# Patient Record
Sex: Female | Born: 1995 | Race: White | Hispanic: No | Marital: Single | Smoking: Former smoker
Health system: Southern US, Community
[De-identification: ages and names within clinical notes are randomized; demographics above are authoritative.]

## PROBLEM LIST (undated history)

## (undated) ENCOUNTER — Inpatient Hospital Stay (HOSPITAL_COMMUNITY): Payer: Self-pay

## (undated) ENCOUNTER — Emergency Department (HOSPITAL_COMMUNITY): Discharge status: Absent without leave | Payer: Self-pay

## (undated) ENCOUNTER — Inpatient Hospital Stay: Payer: Self-pay

## (undated) DIAGNOSIS — N390 Urinary tract infection, site not specified: Secondary | ICD-10-CM

## (undated) DIAGNOSIS — A749 Chlamydial infection, unspecified: Secondary | ICD-10-CM

## (undated) DIAGNOSIS — R51 Headache: Secondary | ICD-10-CM

## (undated) DIAGNOSIS — O1490 Unspecified pre-eclampsia, unspecified trimester: Secondary | ICD-10-CM

## (undated) HISTORY — DX: Unspecified pre-eclampsia, unspecified trimester: O14.90

## (undated) HISTORY — DX: Headache: R51

---
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
## 2015-02-03 ENCOUNTER — Ambulatory Visit: Payer: Medicaid Other | Admitting: Family Medicine

## 2015-03-17 ENCOUNTER — Telehealth: Payer: Self-pay | Admitting: *Deleted

## 2015-03-17 MED ORDER — CIPROFLOXACIN HCL 500 MG PO TABS: 500.0000 mg | ORAL_TABLET | Freq: Two times a day (BID) | ORAL | Status: DC

## 2015-03-17 MED ORDER — FLUCONAZOLE 150 MG PO TABS: 150.0000 mg | ORAL_TABLET | Freq: Once | ORAL | Status: DC

## 2015-03-17 NOTE — Telephone Encounter (Signed)
Patient is calling because she has a urinary tract infection and a yeast infection.  She would like something called in for her as she is unable to take time off from work to be seen for an appointment.

## 2015-03-30 ENCOUNTER — Inpatient Hospital Stay (HOSPITAL_COMMUNITY): Payer: Medicaid Other

## 2015-03-30 ENCOUNTER — Encounter (HOSPITAL_COMMUNITY): Payer: Self-pay | Admitting: *Deleted

## 2015-03-30 ENCOUNTER — Inpatient Hospital Stay (HOSPITAL_COMMUNITY)
Admission: AD | Admit: 2015-03-30 | Discharge: 2015-03-30 | Disposition: A | Discharge status: Home / Self Care | Payer: Medicaid Other | Source: Ambulatory Visit | Attending: Obstetrics & Gynecology | Admitting: Obstetrics & Gynecology

## 2015-03-30 DIAGNOSIS — O209 Hemorrhage in early pregnancy, unspecified: Secondary | ICD-10-CM | POA: Insufficient documentation

## 2015-03-30 DIAGNOSIS — R109 Unspecified abdominal pain: Secondary | ICD-10-CM | POA: Diagnosis not present

## 2015-03-30 DIAGNOSIS — Z3A01 Less than 8 weeks gestation of pregnancy: Secondary | ICD-10-CM | POA: Diagnosis not present

## 2015-03-30 DIAGNOSIS — B373 Candidiasis of vulva and vagina: Secondary | ICD-10-CM | POA: Insufficient documentation

## 2015-03-30 DIAGNOSIS — O9989 Other specified diseases and conditions complicating pregnancy, childbirth and the puerperium: Secondary | ICD-10-CM

## 2015-03-30 DIAGNOSIS — O98811 Other maternal infectious and parasitic diseases complicating pregnancy, first trimester: Secondary | ICD-10-CM | POA: Insufficient documentation

## 2015-03-30 DIAGNOSIS — O26899 Other specified pregnancy related conditions, unspecified trimester: Secondary | ICD-10-CM

## 2015-03-30 DIAGNOSIS — Z87891 Personal history of nicotine dependence: Secondary | ICD-10-CM | POA: Insufficient documentation

## 2015-03-30 DIAGNOSIS — B3731 Acute candidiasis of vulva and vagina: Secondary | ICD-10-CM

## 2015-03-30 HISTORY — DX: Urinary tract infection, site not specified: N39.0

## 2015-03-30 HISTORY — DX: Chlamydial infection, unspecified: A74.9

## 2015-03-30 LAB — CBC WITH DIFFERENTIAL/PLATELET
BASOS ABS: 0 10*3/uL (ref 0.0–0.1)
BASOS PCT: 0 % (ref 0–1)
EOS ABS: 0 10*3/uL (ref 0.0–0.7)
Eosinophils Relative: 0 % (ref 0–5)
HCT: 32.3 % — ABNORMAL LOW (ref 36.0–46.0)
Hemoglobin: 11 g/dL — ABNORMAL LOW (ref 12.0–15.0)
LYMPHS ABS: 1.2 10*3/uL (ref 0.7–4.0)
Lymphocytes Relative: 16 % (ref 12–46)
MCH: 28.4 pg (ref 26.0–34.0)
MCHC: 34.1 g/dL (ref 30.0–36.0)
MCV: 83.2 fL (ref 78.0–100.0)
Monocytes Absolute: 0.8 10*3/uL (ref 0.1–1.0)
Monocytes Relative: 11 % (ref 3–12)
Neutro Abs: 5.1 10*3/uL (ref 1.7–7.7)
Neutrophils Relative %: 73 % (ref 43–77)
PLATELETS: 227 10*3/uL (ref 150–400)
RBC: 3.88 MIL/uL (ref 3.87–5.11)
RDW: 14.1 % (ref 11.5–15.5)
WBC: 7 10*3/uL (ref 4.0–10.5)

## 2015-03-30 LAB — URINALYSIS, DIPSTICK ONLY
Bilirubin Urine: NEGATIVE
Glucose, UA: NEGATIVE mg/dL
Hgb urine dipstick: NEGATIVE
Ketones, ur: NEGATIVE mg/dL
Nitrite: NEGATIVE
Protein, ur: NEGATIVE mg/dL
Specific Gravity, Urine: <1.005 — ABNORMAL LOW (ref 1.005–1.030)
Urobilinogen, UA: 1 mg/dL (ref 0.0–1.0)
pH: 6 (ref 5.0–8.0)

## 2015-03-30 LAB — WET PREP, GENITAL: Trich, Wet Prep: NONE SEEN

## 2015-03-30 LAB — HCG, QUANTITATIVE, PREGNANCY: hCG, Beta Chain, Quant, S: 75469 m[IU]/mL — ABNORMAL HIGH (ref <5)

## 2015-03-30 LAB — POCT PREGNANCY, URINE: Preg Test, Ur: POSITIVE — AB

## 2015-03-30 MED ORDER — TERCONAZOLE 0.4 % VA CREA: 1.0000 | TOPICAL_CREAM | Freq: Every day | VAGINAL | Status: DC

## 2015-03-30 NOTE — MAU Note (Signed)
Pt had a child 8 months ago and havnt had a cycle since.  Had a +HPT two days ago along with low abd cramping. Denies any vag bleeding, or discharge that is different for her nor difficulties while voiding.

## 2015-03-30 NOTE — Discharge Instructions (Signed)

## 2015-03-30 NOTE — MAU Provider Note (Signed)
History     CSN: [id number]  Arrival date and time: 03/30/15 2028   First Provider Initiated Contact with Patient 03/30/15 2106      No chief complaint on file. CC: Abd pain with new pregnancy HPI Meagan Carpenter 19 y.o. [id number] @Unknown  presents to MAU with abdominal cramping in setting of positive pregnancy test.  She reports no period since last child born 08/03/14.  She used Depo one time and then switched to pills but could not remember to take every day. She has not used pills in 2 months.  She has been having abdominal cramping x 2 days.  It comes and goes, up to 7/10.  It is better with rest.  She has not used any medication to treat.  She denies nausea, vomiting, vaginal bleeding, fever, weakness.  She does have dysuria - pain in belly when urinating.  NO vaginal discharge. OB History    Gravida Para Term Preterm AB TAB SAB Ectopic Multiple Living   3 1 1  0 1 0 1 0 0 1      Past Medical History  Diagnosis Date  . Headache(784.0)   . UTI (lower urinary tract infection)   . Chlamydia     Past Surgical History  Procedure Laterality Date  . Elbow fracture surgery  2012    Family History  Problem Relation Age of Onset  . Heart failure Paternal Grandmother     pacemaker  . Hyperlipidemia Paternal Grandmother   . Hypertension Paternal Grandmother   . Cancer Paternal Grandmother 86    great grandmother  . Migraines Paternal Grandmother   . Stroke Paternal Grandfather   . ADD / ADHD Father   . ADD / ADHD Brother   . Autism Other     paternal [place] Grandmother & Paternal Randie Heinz Aunt had Autism    History  Substance Use Topics  . Smoking status: Former Smoker    Types: Cigarettes    Quit date: 01/01/2010  . Smokeless tobacco: Never Used  . Alcohol Use: No    Allergies: No Known Allergies  Prescriptions prior to admission  Medication Sig Dispense Refill Last Dose  . ciprofloxacin (CIPRO) 500 MG tablet Take 1 tablet (500 mg total) by mouth 2 (two) times  daily. (Patient not taking: Reported on 03/30/2015) 6 tablet 0 Completed Course at Unknown time  . fluconazole (DIFLUCAN) 150 MG tablet Take 1 tablet (150 mg total) by mouth once. Repeat if symptoms persist in 2-3 days. (Patient not taking: Reported on 03/30/2015) 2 tablet 0 Completed Course at Unknown time  . norgestimate-ethinyl estradiol (ORTHO-CYCLEN,SPRINTEC,PREVIFEM) 0.25-35 MG-MCG tablet Take 1 tablet by mouth daily. (Patient not taking: Reported on 12/31/2014) 1 Package 11 Not Taking at Unknown time  . sertraline (ZOLOFT) 50 MG tablet Take 1 tablet (50 mg total) by mouth daily. (Patient not taking: Reported on 12/31/2014) 30 tablet 5 Not Taking at Unknown time    ROS Pertinent ROS in HPI.  All other systems are negative.   Physical Exam   Blood pressure 112/71, pulse 79, temperature 97.9 F (36.6 C), temperature source Oral, resp. rate 16, height 5\' 5"  (1.651 m), weight 110 lb (49.896 kg), not currently breastfeeding.  Physical Exam  Constitutional: She is oriented to person, place, and time. She appears well-developed and well-nourished. No distress.  HENT:  Head: Normocephalic and atraumatic.  Eyes: EOM are normal.  Neck: Normal range of motion.  Cardiovascular: Normal rate, regular rhythm and normal heart sounds.   Respiratory: Effort normal and  breath sounds normal. No respiratory distress.  GI: Soft. Bowel sounds are normal. She exhibits no distension. There is no tenderness.  Genitourinary:  Erythematous mucosa Thick, adherent clumps of white discharge in vagina Cervix is closed. No CMT.  No adnexal mass or tenderness  Musculoskeletal: Normal range of motion.  Neurological: She is alert and oriented to person, place, and time.  Skin: Skin is warm and dry.  Psychiatric: She has a normal mood and affect.   Results for orders placed or performed during the hospital encounter of 03/30/15 (from the past 24 hour(s))  Urinalysis, dipstick only     Status: Abnormal   Collection  Time: 03/30/15  8:30 PM  Result Value Ref Range   Specific Gravity, Urine <1.005 (L) 1.005 - 1.030   pH 6.0 5.0 - 8.0   Glucose, UA NEGATIVE NEGATIVE mg/dL   Hgb urine dipstick NEGATIVE NEGATIVE   Bilirubin Urine NEGATIVE NEGATIVE   Ketones, ur NEGATIVE NEGATIVE mg/dL   Protein, ur NEGATIVE NEGATIVE mg/dL   Urobilinogen, UA 1.0 0.0 - 1.0 mg/dL   Nitrite NEGATIVE NEGATIVE   Leukocytes, UA SMALL (A) NEGATIVE  Pregnancy, urine POC     Status: Abnormal   Collection Time: 03/30/15  8:38 PM  Result Value Ref Range   Preg Test, Ur POSITIVE (A) NEGATIVE  CBC with Differential/Platelet     Status: Abnormal   Collection Time: 03/30/15  9:25 PM  Result Value Ref Range   WBC 7.0 4.0 - 10.5 K/uL   RBC 3.88 3.87 - 5.11 MIL/uL   Hemoglobin 11.0 (L) 12.0 - 15.0 g/dL   HCT 16.1 (L) 09.6 - 04.5 %   MCV 83.2 78.0 - 100.0 fL   MCH 28.4 26.0 - 34.0 pg   MCHC 34.1 30.0 - 36.0 g/dL   RDW 40.9 81.1 - 91.4 %   Platelets 227 150 - 400 K/uL   Neutrophils Relative % 73 43 - 77 %   Neutro Abs 5.1 1.7 - 7.7 K/uL   Lymphocytes Relative 16 12 - 46 %   Lymphs Abs 1.2 0.7 - 4.0 K/uL   Monocytes Relative 11 3 - 12 %   Monocytes Absolute 0.8 0.1 - 1.0 K/uL   Eosinophils Relative 0 0 - 5 %   Eosinophils Absolute 0.0 0.0 - 0.7 K/uL   Basophils Relative 0 0 - 1 %   Basophils Absolute 0.0 0.0 - 0.1 K/uL  Wet prep, genital     Status: Abnormal   Collection Time: 03/30/15  9:30 PM  Result Value Ref Range   Yeast Wet Prep HPF POC FEW (A) NONE SEEN   Trich, Wet Prep NONE SEEN NONE SEEN   Clue Cells Wet Prep HPF POC FEW (A) NONE SEEN   WBC, Wet Prep HPF POC TOO NUMEROUS TO COUNT (A) NONE SEEN   US Ob Comp Less 14 Wks  03/30/2015   CLINICAL DATA:  Pregnant patient with pelvic cramping for 3 days.  EXAM: OBSTETRIC <14 WK [place] AND TRANSVAGINAL OB US  TECHNIQUE: Both transabdominal and transvaginal ultrasound examinations were performed for complete evaluation of the gestation as well as the maternal uterus, adnexal  regions, and pelvic cul-de-sac. Transvaginal technique was performed to assess early pregnancy.  COMPARISON:  None.  FINDINGS: Intrauterine gestational sac: Visualized/normal in shape.  Yolk sac:  Present.  Embryo:  Present.  Cardiac Activity: Present.  Heart Rate: 132  bpm  CRL:  8.5  mm   6 w   6 d  [place] EDC: 11/17/2015  Maternal uterus/adnexae: No subchorionic hemorrhage. Both ovaries appear normal in size. There is no adnexal mass. Trace free fluid in the pelvis.  IMPRESSION: Single live intrauterine pregnancy estimated gestational age [redacted] weeks 6 days for estimated date of delivery 11/17/2015.   Electronically Signed   By: Rubye Oaks M.D.   On: 03/30/2015 22:18   US Ob Transvaginal  03/30/2015   CLINICAL DATA:  Pregnant patient with pelvic cramping for 3 days.  EXAM: OBSTETRIC <14 WK [place] AND TRANSVAGINAL OB US  TECHNIQUE: Both transabdominal and transvaginal ultrasound examinations were performed for complete evaluation of the gestation as well as the maternal uterus, adnexal regions, and pelvic cul-de-sac. Transvaginal technique was performed to assess early pregnancy.  COMPARISON:  None.  FINDINGS: Intrauterine gestational sac: Visualized/normal in shape.  Yolk sac:  Present.  Embryo:  Present.  Cardiac Activity: Present.  Heart Rate: 132  bpm  CRL:  8.5  mm   6 w   6 d                  [place] EDC: 11/17/2015  Maternal uterus/adnexae: No subchorionic hemorrhage. Both ovaries appear normal in size. There is no adnexal mass. Trace free fluid in the pelvis.  IMPRESSION: Single live intrauterine pregnancy estimated gestational age [redacted] weeks 6 days for estimated date of delivery 11/17/2015.   Electronically Signed   By: Rubye Oaks M.D.   On: 03/30/2015 22:18    MAU Course  Procedures      MDM Ectopic workup ordered to confirm location of pregnancy due to Positive pregnancy test with abdominal pain.  U/S ordered and images reviewed.  IUP per U/S - no concern for ectopic Yeast seen on  wet prep and consistent with exam   Assessment and Plan   1. Abdominal pain in pregnancy   2. Vaginal bleeding in pregnancy, first trimester   3. Vulvovaginal candidiasis    P: Discharge to home Terazol for yeast [facility] daily [facility] asap Patient may return to MAU as needed or if her condition were to change or worsen   Bertram Denver 03/30/2015, 9:08 PM

## 2015-04-08 ENCOUNTER — Encounter: Payer: Self-pay | Admitting: Family Medicine

## 2015-04-08 ENCOUNTER — Ambulatory Visit (INDEPENDENT_AMBULATORY_CARE_PROVIDER_SITE_OTHER): Payer: Medicaid Other | Admitting: Family Medicine

## 2015-04-08 VITALS — BP 107/71 | HR 91 | Wt 114.0 lb

## 2015-04-08 DIAGNOSIS — Z3490 Encounter for supervision of normal pregnancy, unspecified, unspecified trimester: Secondary | ICD-10-CM | POA: Insufficient documentation

## 2015-04-08 DIAGNOSIS — Z3481 Encounter for supervision of other normal pregnancy, first trimester: Secondary | ICD-10-CM

## 2015-04-08 NOTE — Progress Notes (Signed)
   Subjective:    Meagan Carpenter is a [id number] being seen today for her first obstetrical visit.  Her obstetrical history is not significant. Patient does intend to breast feed. Pregnancy history fully reviewed.  Patient reports no complaints.  Filed Vitals:   04/08/15 1519  BP: 107/71  Pulse: 91  Weight: 114 lb (51.71 kg)    HISTORY: OB History  Gravida Para Term Preterm AB SAB TAB Ectopic Multiple Living  3 1 1  0 1 1 0 0 0 1    # Outcome Date GA Lbr Len/2nd Weight Sex Delivery Anes PTL Lv  3 Current           2 Term 08/03/14 [id number] 03:51 / 02:57 7 lb 3 oz (3.26 kg) M Vag-Vacuum EPI  Y  1 SAB 2014             Past Medical History  Diagnosis Date  . Headache(784.0)   . UTI (lower urinary tract infection)   . Chlamydia    Past Surgical History  Procedure Laterality Date  . Elbow fracture surgery  2012   Family History  Problem Relation Age of Onset  . Heart failure Paternal Grandmother     pacemaker  . Hyperlipidemia Paternal Grandmother   . Hypertension Paternal Grandmother   . Cancer Paternal Grandmother [id number]    great grandmother  . Migraines Paternal Grandmother   . Stroke Paternal Grandfather   . ADD / ADHD Father   . ADD / ADHD Brother   . Autism Other     paternal [place]Great Grandmother & Paternal Randie HeinzGreat Aunt had Autism     Exam    System: Breast:  normal appearance, no masses or tenderness   Skin: normal coloration and turgor, no rashes    Neurologic: normal   Extremities: normal strength, tone, and muscle mass   HEENT extra ocular movement intact and sclera clear, anicteric   Mouth/Teeth mucous membranes moist, pharynx normal without lesions   Neck supple   Cardiovascular: regular rate and rhythm, no murmurs or gallops   Respiratory:  appears well, vitals normal, no respiratory distress, acyanotic, normal RR, ear and throat exam is normal, neck free of mass or lymphadenopathy, chest clear, no wheezing, crepitations, rhonchi, normal symmetric air  entry   Abdomen: soft, non-tender; bowel sounds normal; no masses,  no organomegaly   TVUS reveals SIUP + flicker c/w dates   Assessment:    Pregnancy: [id number] Patient Active Problem List   Diagnosis Date Noted  . Supervision of normal pregnancy, antepartum 04/08/2015  . Tension headache 03/23/2013  . Migraine headache without aura 03/23/2013        Plan:     Initial labs drawn. Prenatal vitamins. Problem list reviewed and updated. Genetic Screening discussed First Screen: declined. Problem List Items Addressed This Visit      Medium   Supervision of normal pregnancy, antepartum - Primary   Relevant Orders   Prenatal Profile   Culture, OB Urine   GC/Chlamydia Probe Amp     Return in 4 weeks (on 05/06/2015).   [name] 04/08/2015

## 2015-04-08 NOTE — Patient Instructions (Signed)
First Trimester of Pregnancy The first trimester of pregnancy is from week 1 until the end of week 12 (months 1 through 3). A week after a sperm fertilizes an egg, the egg will implant on the wall of the uterus. This embryo will begin to develop into a baby. Genes from you and your partner are forming the baby. The female genes determine whether the baby is a boy or a girl. At 6-8 weeks, the eyes and face are formed, and the heartbeat can be seen on ultrasound. At the end of 12 weeks, all the baby's organs are formed.  Now that you are pregnant, you will want to do everything you can to have a healthy baby. Two of the most important things are to get good prenatal care and to follow your health care provider's instructions. Prenatal care is all the medical care you receive before the baby's birth. This care will help prevent, find, and treat any problems during the pregnancy and childbirth. BODY CHANGES Your body goes through many changes during pregnancy. The changes vary from woman to woman.   You may gain or lose a couple of pounds at first.  You may feel sick to your stomach (nauseous) and throw up (vomit). If the vomiting is uncontrollable, call your health care provider.  You may tire easily.  You may develop headaches that can be relieved by medicines approved by your health care provider.  You may urinate more often. Painful urination may mean you have a bladder infection.  You may develop heartburn as a result of your pregnancy.  You may develop constipation because certain hormones are causing the muscles that push waste through your intestines to slow down.  You may develop hemorrhoids or swollen, bulging veins (varicose veins).  Your breasts may begin to grow larger and become tender. Your nipples may stick out more, and the tissue that surrounds them (areola) may become darker.  Your gums may bleed and may be sensitive to brushing and flossing.  Dark spots or blotches  (chloasma, mask of pregnancy) may develop on your face. This will likely fade after the baby is born.  Your menstrual periods will stop.  You may have a loss of appetite.  You may develop cravings for certain kinds of food.  You may have changes in your emotions from day to day, such as being excited to be pregnant or being concerned that something may go wrong with the pregnancy and baby.  You may have more vivid and strange dreams.  You may have changes in your hair. These can include thickening of your hair, rapid growth, and changes in texture. Some women also have hair loss during or after pregnancy, or hair that feels dry or thin. Your hair will most likely return to normal after your baby is born. WHAT TO EXPECT AT YOUR PRENATAL VISITS During a routine prenatal visit:  You will be weighed to make sure you and the baby are growing normally.  Your blood pressure will be taken.  Your abdomen will be measured to track your baby's growth.  The fetal heartbeat will be listened to starting around week 10 or 12 of your pregnancy.  Test results from any previous visits will be discussed. Your health care provider may ask you:  How you are feeling.  If you are feeling the baby move.  If you have had any abnormal symptoms, such as leaking fluid, bleeding, severe headaches, or abdominal cramping.  If you have any questions. Other tests   that may be performed during your first trimester include:  Blood tests to find your blood type and to check for the presence of any previous infections. They will also be used to check for low iron levels (anemia) and Rh antibodies. Later in the pregnancy, blood tests for diabetes will be done along with other tests if problems develop.  Urine tests to check for infections, diabetes, or protein in the urine.  An ultrasound to confirm the proper growth and development of the baby.  An amniocentesis to check for possible genetic problems.  Fetal  screens for spina bifida and Down syndrome.  You may need other tests to make sure you and the baby are doing well. HOME CARE INSTRUCTIONS  Medicines  Follow your health care provider's instructions regarding medicine use. Specific medicines may be either safe or unsafe to take during pregnancy.  Take your prenatal vitamins as directed.  If you develop constipation, try taking a stool softener if your health care provider approves. Diet  Eat regular, well-balanced meals. Choose a variety of foods, such as meat or vegetable-based protein, fish, milk and low-fat dairy products, vegetables, fruits, and whole grain breads and cereals. Your health care provider will help you determine the amount of weight gain that is right for you.  Avoid raw meat and uncooked cheese. These carry germs that can cause birth defects in the baby.  Eating four or five small meals rather than three large meals a day may help relieve nausea and vomiting. If you start to feel nauseous, eating a few soda crackers can be helpful. Drinking liquids between meals instead of during meals also seems to help nausea and vomiting.  If you develop constipation, eat more high-fiber foods, such as fresh vegetables or fruit and whole grains. Drink enough fluids to keep your urine clear or pale yellow. Activity and Exercise  Exercise only as directed by your health care provider. Exercising will help you:  Control your weight.  Stay in shape.  Be prepared for labor and delivery.  Experiencing pain or cramping in the lower abdomen or low back is a good sign that you should stop exercising. Check with your health care provider before continuing normal exercises.  Try to avoid standing for long periods of time. Move your legs often if you must stand in one place for a long time.  Avoid heavy lifting.  Wear low-heeled shoes, and practice good posture.  You may continue to have sex unless your health care provider directs you  otherwise. Relief of Pain or Discomfort  Wear a good support bra for breast tenderness.   Take warm sitz baths to soothe any pain or discomfort caused by hemorrhoids. Use hemorrhoid cream if your health care provider approves.   Rest with your legs elevated if you have leg cramps or low back pain.  If you develop varicose veins in your legs, wear support hose. Elevate your feet for 15 minutes, 3-4 times a day. Limit salt in your diet. Prenatal Care  Schedule your prenatal visits by the twelfth week of pregnancy. They are usually scheduled monthly at first, then more often in the last 2 months before delivery.  Write down your questions. Take them to your prenatal visits.  Keep all your prenatal visits as directed by your health care provider. Safety  Wear your seat belt at all times when driving.  Make a list of emergency phone numbers, including numbers for family, friends, the hospital, and police and fire departments. General Tips    Ask your health care provider for a referral to a local prenatal education class. Begin classes no later than at the beginning of month 6 of your pregnancy.  Ask for help if you have counseling or nutritional needs during pregnancy. Your health care provider can offer advice or refer you to specialists for help with various needs.  Do not use hot tubs, steam rooms, or saunas.  Do not douche or use tampons or scented sanitary pads.  Do not cross your legs for long periods of time.  Avoid cat litter boxes and soil used by cats. These carry germs that can cause birth defects in the baby and possibly loss of the fetus by miscarriage or stillbirth.  Avoid all smoking, herbs, alcohol, and medicines not prescribed by your health care provider. Chemicals in these affect the formation and growth of the baby.  Schedule a dentist appointment. At home, brush your teeth with a soft toothbrush and be gentle when you floss. SEEK MEDICAL CARE IF:   You have  dizziness.  You have mild pelvic cramps, pelvic pressure, or nagging pain in the abdominal area.  You have persistent nausea, vomiting, or diarrhea.  You have a bad smelling vaginal discharge.  You have pain with urination.  You notice increased swelling in your face, hands, legs, or ankles. SEEK IMMEDIATE MEDICAL CARE IF:   You have a fever.  You are leaking fluid from your vagina.  You have spotting or bleeding from your vagina.  You have severe abdominal cramping or pain.  You have rapid weight gain or loss.  You vomit blood or material that looks like coffee grounds.  You are exposed to German measles and have never had them.  You are exposed to fifth disease or chickenpox.  You develop a severe headache.  You have shortness of breath.  You have any kind of trauma, such as from a fall or a car accident. Document Released: 09/14/2001 Document Revised: 02/04/2014 Document Reviewed: 07/31/2013 [employer] Patient Information 2015 [employer]. This information is not intended to replace advice given to you by your health care provider. Make sure you discuss any questions you have with your health care provider.  Breastfeeding Deciding to breastfeed is one of the best choices you can make for you and your baby. A change in hormones during pregnancy causes your breast tissue to grow and increases the number and size of your milk ducts. These hormones also allow proteins, sugars, and fats from your blood supply to make breast milk in your milk-producing glands. Hormones prevent breast milk from being released before your baby is born as well as prompt milk flow after birth. Once breastfeeding has begun, thoughts of your baby, as well as his or her sucking or crying, can stimulate the release of milk from your milk-producing glands.  BENEFITS OF BREASTFEEDING For Your Baby  Your first milk (colostrum) helps your baby's digestive system function better.   There are antibodies  in your milk that help your baby fight off infections.   Your baby has a lower incidence of asthma, allergies, and sudden infant death syndrome.   The nutrients in breast milk are better for your baby than infant formulas and are designed uniquely for your baby's needs.   Breast milk improves your baby's brain development.   Your baby is less likely to develop other conditions, such as childhood obesity, asthma, or type 2 diabetes mellitus.  For You   Breastfeeding helps to create a very special bond between   you and your baby.   Breastfeeding is convenient. Breast milk is always available at the correct temperature and costs nothing.   Breastfeeding helps to burn calories and helps you lose the weight gained during pregnancy.   Breastfeeding makes your uterus contract to its prepregnancy size faster and slows bleeding (lochia) after you give birth.   Breastfeeding helps to lower your risk of developing type 2 diabetes mellitus, osteoporosis, and breast or ovarian cancer later in life. SIGNS THAT YOUR BABY IS HUNGRY Early Signs of Hunger  Increased alertness or activity.  Stretching.  Movement of the head from side to side.  Movement of the head and opening of the mouth when the corner of the mouth or cheek is stroked (rooting).  Increased sucking sounds, smacking lips, cooing, sighing, or squeaking.  Hand-to-mouth movements.  Increased sucking of fingers or hands. Late Signs of Hunger  Fussing.  Intermittent crying. Extreme Signs of Hunger Signs of extreme hunger will require calming and consoling before your baby will be able to breastfeed successfully. Do not wait for the following signs of extreme hunger to occur before you initiate breastfeeding:   Restlessness.  A loud, strong cry.   Screaming. BREASTFEEDING BASICS Breastfeeding Initiation  Find a comfortable place to sit or lie down, with your neck and back well supported.  Place a pillow or  rolled up blanket under your baby to bring him or her to the level of your breast (if you are seated). Nursing pillows are specially designed to help support your arms and your baby while you breastfeed.  Make sure that your baby's abdomen is facing your abdomen.   Gently massage your breast. With your fingertips, massage from your chest wall toward your nipple in a circular motion. This encourages milk flow. You may need to continue this action during the feeding if your milk flows slowly.  Support your breast with 4 fingers underneath and your thumb above your nipple. Make sure your fingers are well away from your nipple and your baby's mouth.   Stroke your baby's lips gently with your finger or nipple.   When your baby's mouth is open wide enough, quickly bring your baby to your breast, placing your entire nipple and as much of the colored area around your nipple (areola) as possible into your baby's mouth.   More areola should be visible above your baby's upper lip than below the lower lip.   Your baby's tongue should be between his or her lower gum and your breast.   Ensure that your baby's mouth is correctly positioned around your nipple (latched). Your baby's lips should create a seal on your breast and be turned out (everted).  It is common for your baby to suck about 2-3 minutes in order to start the flow of breast milk. Latching Teaching your baby how to latch on to your breast properly is very important. An improper latch can cause nipple pain and decreased milk supply for you and poor weight gain in your baby. Also, if your baby is not latched onto your nipple properly, he or she may swallow some air during feeding. This can make your baby fussy. Burping your baby when you switch breasts during the feeding can help to get rid of the air. However, teaching your baby to latch on properly is still the best way to prevent fussiness from swallowing air while breastfeeding. Signs  that your baby has successfully latched on to your nipple:    Silent tugging or silent   sucking, without causing you pain.   Swallowing heard between every 3-4 sucks.    Muscle movement above and in front of his or her ears while sucking.  Signs that your baby has not successfully latched on to nipple:   Sucking sounds or smacking sounds from your baby while breastfeeding.  Nipple pain. If you think your baby has not latched on correctly, slip your finger into the corner of your baby's mouth to break the suction and place it between your baby's gums. Attempt breastfeeding initiation again. Signs of Successful Breastfeeding Signs from your baby:   A gradual decrease in the number of sucks or complete cessation of sucking.   Falling asleep.   Relaxation of his or her body.   Retention of a small amount of milk in his or her mouth.   Letting go of your breast by himself or herself. Signs from you:  Breasts that have increased in firmness, weight, and size 1-3 hours after feeding.   Breasts that are softer immediately after breastfeeding.  Increased milk volume, as well as a change in milk consistency and color by the fifth day of breastfeeding.   Nipples that are not sore, cracked, or bleeding. Signs That Your Baby is Getting Enough Milk  Wetting at least 3 diapers in a 24-hour period. The urine should be clear and pale yellow by age 5 days.  At least 3 stools in a 24-hour period by age 5 days. The stool should be soft and yellow.  At least 3 stools in a 24-hour period by age 7 days. The stool should be seedy and yellow.  No loss of weight greater than 10% of birth weight during the first 3 days of age.  Average weight gain of 4-7 ounces (113-198 g) per week after age 4 days.  Consistent daily weight gain by age 5 days, without weight loss after the age of 2 weeks. After a feeding, your baby may spit up a small amount. This is common. BREASTFEEDING FREQUENCY AND  DURATION Frequent feeding will help you make more milk and can prevent sore nipples and breast engorgement. Breastfeed when you feel the need to reduce the fullness of your breasts or when your baby shows signs of hunger. This is called "breastfeeding on demand." Avoid introducing a pacifier to your baby while you are working to establish breastfeeding (the first 4-6 weeks after your baby is born). After this time you may choose to use a pacifier. Research has shown that pacifier use during the first year of a baby's life decreases the risk of sudden infant death syndrome (SIDS). Allow your baby to feed on each breast as long as he or she wants. Breastfeed until your baby is finished feeding. When your baby unlatches or falls asleep while feeding from the first breast, offer the second breast. Because newborns are often sleepy in the first few weeks of life, you may need to awaken your baby to get him or her to feed. Breastfeeding times will vary from baby to baby. However, the following rules can serve as a guide to help you ensure that your baby is properly fed:  Newborns (babies 4 weeks of age or younger) may breastfeed every 1-3 hours.  Newborns should not go longer than 3 hours during the day or 5 hours during the night without breastfeeding.  You should breastfeed your baby a minimum of 8 times in a 24-hour period until you begin to introduce solid foods to your baby at around 6   months of age. BREAST MILK PUMPING Pumping and storing breast milk allows you to ensure that your baby is exclusively fed your breast milk, even at times when you are unable to breastfeed. This is especially important if you are going back to work while you are still breastfeeding or when you are not able to be present during feedings. Your lactation consultant can give you guidelines on how long it is safe to store breast milk.  A breast pump is a machine that allows you to pump milk from your breast into a sterile bottle.  The pumped breast milk can then be stored in a refrigerator or freezer. Some breast pumps are operated by hand, while others use electricity. Ask your lactation consultant which type will work best for you. Breast pumps can be purchased, but some hospitals and breastfeeding support groups lease breast pumps on a monthly basis. A lactation consultant can teach you how to hand express breast milk, if you prefer not to use a pump.  CARING FOR YOUR BREASTS WHILE YOU BREASTFEED Nipples can become dry, cracked, and sore while breastfeeding. The following recommendations can help keep your breasts moisturized and healthy:  Avoid using soap on your nipples.   Wear a supportive bra. Although not required, special nursing bras and tank tops are designed to allow access to your breasts for breastfeeding without taking off your entire bra or top. Avoid wearing underwire-style bras or extremely tight bras.  Air dry your nipples for 3-4minutes after each feeding.   Use only cotton bra pads to absorb leaked breast milk. Leaking of breast milk between feedings is normal.   Use lanolin on your nipples after breastfeeding. Lanolin helps to maintain your skin's normal moisture barrier. If you use pure lanolin, you do not need to wash it off before feeding your baby again. Pure lanolin is not toxic to your baby. You may also hand express a few drops of breast milk and gently massage that milk into your nipples and allow the milk to air dry. In the first few weeks after giving birth, some women experience extremely full breasts (engorgement). Engorgement can make your breasts feel heavy, warm, and tender to the touch. Engorgement peaks within 3-5 days after you give birth. The following recommendations can help ease engorgement:  Completely empty your breasts while breastfeeding or pumping. You may want to start by applying warm, moist heat (in the shower or with warm water-soaked hand towels) just before feeding or  pumping. This increases circulation and helps the milk flow. If your baby does not completely empty your breasts while breastfeeding, pump any extra milk after he or she is finished.  Wear a snug bra (nursing or regular) or tank top for 1-2 days to signal your body to slightly decrease milk production.  Apply ice packs to your breasts, unless this is too uncomfortable for you.  Make sure that your baby is latched on and positioned properly while breastfeeding. If engorgement persists after 48 hours of following these recommendations, contact your health care provider or a lactation consultant. OVERALL HEALTH CARE RECOMMENDATIONS WHILE BREASTFEEDING  Eat healthy foods. Alternate between meals and snacks, eating 3 of each per day. Because what you eat affects your breast milk, some of the foods may make your baby more irritable than usual. Avoid eating these foods if you are sure that they are negatively affecting your baby.  Drink milk, fruit juice, and water to satisfy your thirst (about 10 glasses a day).   Rest   often, relax, and continue to take your prenatal vitamins to prevent fatigue, stress, and anemia.  Continue breast self-awareness checks.  Avoid chewing and smoking tobacco.  Avoid alcohol and drug use. Some medicines that may be harmful to your baby can pass through breast milk. It is important to ask your health care provider before taking any medicine, including all over-the-counter and prescription medicine as well as vitamin and herbal supplements. It is possible to become pregnant while breastfeeding. If birth control is desired, ask your health care provider about options that will be safe for your baby. SEEK MEDICAL CARE IF:   You feel like you want to stop breastfeeding or have become frustrated with breastfeeding.  You have painful breasts or nipples.  Your nipples are cracked or bleeding.  Your breasts are red, tender, or warm.  You have a swollen area on either  breast.  You have a fever or chills.  You have nausea or vomiting.  You have drainage other than breast milk from your nipples.  Your breasts do not become full before feedings by the fifth day after you give birth.  You feel sad and depressed.  Your baby is too sleepy to eat well.  Your baby is having trouble sleeping.   Your baby is wetting less than 3 diapers in a 24-hour period.  Your baby has less than 3 stools in a 24-hour period.  Your baby's skin or the white part of his or her eyes becomes yellow.   Your baby is not gaining weight by 5 days of age. SEEK IMMEDIATE MEDICAL CARE IF:   Your baby is overly tired (lethargic) and does not want to wake up and feed.  Your baby develops an unexplained fever. Document Released: 09/20/2005 Document Revised: 09/25/2013 Document Reviewed: 03/14/2013 [employer] Patient Information 2015 [employer]. This information is not intended to replace advice given to you by your health care provider. Make sure you discuss any questions you have with your health care provider.  

## 2015-04-08 NOTE — Progress Notes (Signed)
Bedside U/S showed IUP with CRL 17.324mm and GA 8 w 2 day and FHT 181 BPM

## 2015-04-09 LAB — PRENATAL PROFILE (SOLSTAS)
Antibody Screen: NEGATIVE
BASOS ABS: 0 10*3/uL (ref 0.0–0.1)
Basophils Relative: 0 % (ref 0–1)
Eosinophils Absolute: 0 10*3/uL (ref 0.0–0.7)
Eosinophils Relative: 0 % (ref 0–5)
HCT: 34.6 % — ABNORMAL LOW (ref 36.0–46.0)
HIV 1&2 Ab, 4th Generation: NONREACTIVE
Hemoglobin: 11.5 g/dL — ABNORMAL LOW (ref 12.0–15.0)
Hepatitis B Surface Ag: NEGATIVE
LYMPHS ABS: 1.6 10*3/uL (ref 0.7–4.0)
LYMPHS PCT: 18 % (ref 12–46)
MCH: 28 pg (ref 26.0–34.0)
MCHC: 33.2 g/dL (ref 30.0–36.0)
MCV: 84.2 fL (ref 78.0–100.0)
MPV: 10.8 fL (ref 8.6–12.4)
Monocytes Absolute: 0.4 10*3/uL (ref 0.1–1.0)
Monocytes Relative: 5 % (ref 3–12)
Neutro Abs: 6.9 10*3/uL (ref 1.7–7.7)
Neutrophils Relative %: 77 % (ref 43–77)
PLATELETS: 359 10*3/uL (ref 150–400)
RBC: 4.11 MIL/uL (ref 3.87–5.11)
RDW: 14.9 % (ref 11.5–15.5)
Rh Type: POSITIVE
Rubella: 1.85 Index — ABNORMAL HIGH (ref <0.90)
WBC: 8.9 10*3/uL (ref 4.0–10.5)

## 2015-04-10 LAB — CULTURE, OB URINE

## 2015-04-11 LAB — GC/CHLAMYDIA PROBE AMP
CT PROBE, AMP APTIMA: NEGATIVE
GC Probe RNA: NEGATIVE

## 2015-04-16 ENCOUNTER — Telehealth: Payer: Self-pay | Admitting: *Deleted

## 2015-04-16 DIAGNOSIS — Z3491 Encounter for supervision of normal pregnancy, unspecified, first trimester: Secondary | ICD-10-CM

## 2015-04-16 MED ORDER — CONCEPT DHA 53.5-38-1 MG PO CAPS: ORAL_CAPSULE | ORAL | Status: DC

## 2015-04-16 NOTE — Telephone Encounter (Signed)
Patient called and needed refills on prenatal vitamins.  I have sent in refills.

## 2015-05-06 ENCOUNTER — Encounter: Payer: Self-pay | Admitting: Obstetrics & Gynecology

## 2015-05-21 ENCOUNTER — Ambulatory Visit (INDEPENDENT_AMBULATORY_CARE_PROVIDER_SITE_OTHER): Payer: Medicaid Other | Admitting: Certified Nurse Midwife

## 2015-05-21 VITALS — BP 111/75 | HR 77 | Wt 117.0 lb

## 2015-05-21 DIAGNOSIS — Z3482 Encounter for supervision of other normal pregnancy, second trimester: Secondary | ICD-10-CM

## 2015-05-21 NOTE — Progress Notes (Signed)
Subjective:  Meagan Carpenter is a 19 y.o. G3P1011 at [redacted]w[redacted]d being seen today for ongoing prenatal care.  Patient reports no complaints.  Contractions: Not present.  Vag. Bleeding: None. Movement: Absent. Denies leaking of fluid.   The following portions of the patient's history were reviewed and updated as appropriate: allergies, current medications, past family history, past medical history, past social history, past surgical history and problem list.   Objective:   Filed Vitals:   05/21/15 1448  BP: 111/75  Pulse: 77  Weight: 117 lb (53.071 kg)    Fetal Status: Fetal Heart Rate (bpm): 147   Movement: Absent     General:  Alert, oriented and cooperative. Patient is in no acute distress.  Skin: Skin is warm and dry. No rash noted.   Cardiovascular: Normal heart rate noted  Respiratory: Normal respiratory effort, no problems with respiration noted  Abdomen: Soft, gravid, appropriate for gestational age. Pain/Pressure: Present     Pelvic: Vag. Bleeding: None Vag D/C Character: Thin   Cervical exam deferred        Extremities: Normal range of motion.  Edema: None  Mental Status: Normal mood and affect. Normal behavior. Normal judgment and thought content.   Urinalysis: Urine Protein: Trace Urine Glucose: Negative  Assessment and Plan:  Pregnancy: G3P1011 at [redacted]w[redacted]d  There are no diagnoses linked to this encounter. Preterm labor symptoms and general obstetric precautions including but not limited to vaginal bleeding, contractions, leaking of fluid and fetal movement were reviewed in detail with the patient. Please refer to After Visit Summary for other counseling recommendations.  No Follow-up on file.   Rhea Pink, CNM

## 2015-05-21 NOTE — Patient Instructions (Signed)
Second Trimester of Pregnancy The second trimester is from week 13 through week 28, months 4 through 6. The second trimester is often a time when you feel your best. Your body has also adjusted to being pregnant, and you begin to feel better physically. Usually, morning sickness has lessened or quit completely, you may have more energy, and you may have an increase in appetite. The second trimester is also a time when the fetus is growing rapidly. At the end of the sixth month, the fetus is about 9 inches long and weighs about 1 pounds. You will likely begin to feel the baby move (quickening) between 18 and 20 weeks of the pregnancy. BODY CHANGES Your body goes through many changes during pregnancy. The changes vary from woman to woman.   Your weight will continue to increase. You will notice your lower abdomen bulging out.  You may begin to get stretch marks on your hips, abdomen, and breasts.  You may develop headaches that can be relieved by medicines approved by your health care provider.  You may urinate more often because the fetus is pressing on your bladder.  You may develop or continue to have heartburn as a result of your pregnancy.  You may develop constipation because certain hormones are causing the muscles that push waste through your intestines to slow down.  You may develop hemorrhoids or swollen, bulging veins (varicose veins).  You may have back pain because of the weight gain and pregnancy hormones relaxing your joints between the bones in your pelvis and as a result of a shift in weight and the muscles that support your balance.  Your breasts will continue to grow and be tender.  Your gums may bleed and may be sensitive to brushing and flossing.  Dark spots or blotches (chloasma, mask of pregnancy) may develop on your face. This will likely fade after the baby is born.  A dark line from your belly button to the pubic area (linea nigra) may appear. This will likely fade  after the baby is born.  You may have changes in your hair. These can include thickening of your hair, rapid growth, and changes in texture. Some women also have hair loss during or after pregnancy, or hair that feels dry or thin. Your hair will most likely return to normal after your baby is born. WHAT TO EXPECT AT YOUR PRENATAL VISITS During a routine prenatal visit:  You will be weighed to make sure you and the fetus are growing normally.  Your blood pressure will be taken.  Your abdomen will be measured to track your baby's growth.  The fetal heartbeat will be listened to.  Any test results from the previous visit will be discussed. Your health care provider may ask you:  How you are feeling.  If you are feeling the baby move.  If you have had any abnormal symptoms, such as leaking fluid, bleeding, severe headaches, or abdominal cramping.  If you have any questions. Other tests that may be performed during your second trimester include:  Blood tests that check for:  Low iron levels (anemia).  Gestational diabetes (between 24 and 28 weeks).  Rh antibodies.  Urine tests to check for infections, diabetes, or protein in the urine.  An ultrasound to confirm the proper growth and development of the baby.  An amniocentesis to check for possible genetic problems.  Fetal screens for spina bifida and Down syndrome. HOME CARE INSTRUCTIONS   Avoid all smoking, herbs, alcohol, and unprescribed   drugs. These chemicals affect the formation and growth of the baby.  Follow your health care provider's instructions regarding medicine use. There are medicines that are either safe or unsafe to take during pregnancy.  Exercise only as directed by your health care provider. Experiencing uterine cramps is a good sign to stop exercising.  Continue to eat regular, healthy meals.  Wear a good support bra for breast tenderness.  Do not use hot tubs, steam rooms, or saunas.  Wear your  seat belt at all times when driving.  Avoid raw meat, uncooked cheese, cat litter boxes, and soil used by cats. These carry germs that can cause birth defects in the baby.  Take your prenatal vitamins.  Try taking a stool softener (if your health care provider approves) if you develop constipation. Eat more high-fiber foods, such as fresh vegetables or fruit and whole grains. Drink plenty of fluids to keep your urine clear or pale yellow.  Take warm sitz baths to soothe any pain or discomfort caused by hemorrhoids. Use hemorrhoid cream if your health care provider approves.  If you develop varicose veins, wear support hose. Elevate your feet for 15 minutes, 3-4 times a day. Limit salt in your diet.  Avoid heavy lifting, wear low heel shoes, and practice good posture.  Rest with your legs elevated if you have leg cramps or low back pain.  Visit your dentist if you have not gone yet during your pregnancy. Use a soft toothbrush to brush your teeth and be gentle when you floss.  A sexual relationship may be continued unless your health care provider directs you otherwise.  Continue to go to all your prenatal visits as directed by your health care provider. SEEK MEDICAL CARE IF:   You have dizziness.  You have mild pelvic cramps, pelvic pressure, or nagging pain in the abdominal area.  You have persistent nausea, vomiting, or diarrhea.  You have a bad smelling vaginal discharge.  You have pain with urination. SEEK IMMEDIATE MEDICAL CARE IF:   You have a fever.  You are leaking fluid from your vagina.  You have spotting or bleeding from your vagina.  You have severe abdominal cramping or pain.  You have rapid weight gain or loss.  You have shortness of breath with chest pain.  You notice sudden or extreme swelling of your face, hands, ankles, feet, or legs.  You have not felt your baby move in over an hour.  You have severe headaches that do not go away with  medicine.  You have vision changes. Document Released: 09/14/2001 Document Revised: 09/25/2013 Document Reviewed: 11/21/2012 Exit[employer] Patient Information 2015 [employer]. This information is not intended to replace advice given to you by your health care provider. Make sure you discuss any questions you have with your health care provider.  

## 2015-06-18 ENCOUNTER — Ambulatory Visit (INDEPENDENT_AMBULATORY_CARE_PROVIDER_SITE_OTHER): Payer: Medicaid Other | Admitting: Certified Nurse Midwife

## 2015-06-18 VITALS — BP 109/71 | HR 91 | Wt 118.0 lb

## 2015-06-18 DIAGNOSIS — Z3482 Encounter for supervision of other normal pregnancy, second trimester: Secondary | ICD-10-CM

## 2015-06-18 DIAGNOSIS — Z3492 Encounter for supervision of normal pregnancy, unspecified, second trimester: Secondary | ICD-10-CM

## 2015-06-18 NOTE — Patient Instructions (Signed)
Prenatal Vitamin and Mineral Combinations (oral solid dosage forms) What is this medicine? PRENATAL VITAMIN AND MINERAL combinations are used before, during, and after pregnancy to help provide provide good nutrition. This medicine may be used for other purposes; ask your health care provider or pharmacist if you have questions. COMMON BRAND NAME(S): Active OB, Advanced Care Plus, Advanced NatalCare, Advanced-RF NatalCare, Aminate Fe, Anemagen OB, B-Nexa, BP FoliNatal Plus B, BP MultiNatal Plus, BP MultiNatal Plus Chewable, BP Prenate, Brainstrong, Bright Beginnings Prenatal, Cal-Nate, Calcium PNV, CareNatal DHA, CareNate 600, Cavan One Omega, Cavan Prenatal with EC Calcium, Cavan-Alpha, Cavan-EC SOD DHA, Cavan-Heme OB, Cavan-Heme Omega, Cenogen Ultra, Centrum Specialist Prenatal, CertaVite with Antioxidants, Choice-OB + DHA, Citracal Prenatal, Citracal Prenatal + DHA, CitraNatal 90 DHA, CitraNatal Assure, CitraNatal B-Calm, CitraNatal DHA, CitraNatal Harmony, CitraNatal Rx, Classic Prenatal, ComBi Rx, Complete Natal DHA, Complete-RF, CompleteNate, Concept DHA, Concept OB, CoreNate-DHA, Corvite FE with Quatrefolic, CRNatal DHA, Daily Vitamin, Docosavit, Duet, Duet Chewable, Duet DHA, Duet DHA 400, Duet DHA 430ec, Duet DHA Balanced, Duet DHA Complete, Duet DHA Complete Gluten Free, Duet DHA EC, Duet DHA Ferrazone, EC Omega-3, DuoVit DHA, Edge OB, Elite OB, Elite OB with DHA, Elite-OB 400, Extra-Virt Plus, Femecal OB, Femecal OB Plus DHA, Ferrocite Plus, Folbecal, Folcal DHA, Folcaps Care One, Folcaps Omega 3, Folet One with Quatrefolic, Folivane OB, Folivane-EC Calcium DHA NF, Foltabs 90 Plus DHA, Foltabs Prenatal, Foltabs Prenatal Plus DHA, Gesticare, Gesticare DHA, Gesticare DHA Delayed-Release, HemeNatal OB, HemeNatal OB + DHA, Hemocyte Plus, HIP Prenatal, ICAR Prenatal Rx, iNatal Advance, iNatal GT, iNatal Ultra, Infanate Balance, Infanate DHA, Infanate Plus, Kolnatal, Lactocal-F, Levomefolate PNV, MACNATAL  CN DHA, Marnatal-F, Marnatal-F Plus, Materna, Maxinate, Mission Prenatal, Mission Prenatal F.A., Mission Prenatal H.P., Mom's Choice Rx, Multi-Nate 30, Multi-Nate 30 DHA, Multi-Nate DHA Extra, Multifol Plus, Multivitamin With Minerals, MyNatal OB Prenatal, NataCaps, NataChew, NataFolic-OB, Natafort, Natal-V RX, NatalCare CFe 60, NatalCare GlossTabs, NatalCare PIC, NatalCare PIC Forte, NatalCare Plus, NatalCare Rx, NatalCare Three, NATALVIRT 90 DHA, NATALVIRT CA, NataTab CFe, NataTab FA, Natatab Rx, Natelle, Natelle C, Natelle One, Natelle Plus with DHA, Natelle Prefer, Natelle-ez, Navatab + DHA, Neevo, Neevo DHA, Nestabs, Nestabs ABC, Nestabs CBF, Nestabs DHA, Nestabs FA, Nestabs Rx, New Advanced Formula Prenatal Z, Nexa Plus, Nexa Select, Niferex-PN, Niferex-PN Forte, NovaNatal, NovaStart, Nu-Natal, NutraCare, Nutri-Tab OB, Nutri-Tab OB + DHA, Nutrinate, NutriSpire, O-Cal F.A., O-Cal Prenatal, OB Choice, OB Complete, OB Complete 400, OB Complete One, OB Complete Petite, OB Complete Premier, OB Complete with DHA, OB-Natal One, Obstetrix-100, Obtrex, Obtrex DHA, One-A-Day Women's, One-A-Day Women's Prenatal, OptiNate, Paire OB Tablet Plus DHA, PNV OB + DHA, PNV Prenatal, PNV Prenatal Plus Multivitamin, PNV Tabs 29-1, PNV-DHA, PNV-DHA + Docusate, PNV-DHA Plus, PNV-First, PNV-Iron, PNV-OB with DHA, PNV-Omega, PNV-Select, PNV-Total with DHA, PNV-VP-U, PR Natal 400, PR Natal 400ec, PR Natal 430, PR Natal 430ec, PR Natal 440ec, PreCare, PreferaOB, PreferaOB + DHA, PreferaOB One, Premesis Rx, Prena1 PEARL, Prena1 Plus, PrenaCare, Prenafirst, Prenaissance, Prenaissance 90 DHA, Prenaissance Balance, Prenaissance DHA, Prenaissance Harmony DHA, Prenaissance Next, Prenaissance Plus, Prenaissance Promise, PrenaPlus, Prenat with Quatrefolic, PreNata Multivitamin with Iron, Prenatabs CBF, Prenatabs FA, Prenatabs OBN, Prenatabs RX, Prenatal, Prenatal 1 Plus 1, Prenatal 19, Prenatal AD, Prenatal Formula 3, Prenatal H, Prenatal Low  Iron, Prenatal MR 90 Fe, Prenatal MTR with Selenium, Prenatal Multivitamin + DHA 2, Prenatal Optima Advance, Prenatal Plus, Prenatal Plus Iron, Prenatal Plus Low Iron, Prenatal Rx with Beta Carotene, Prenatal S, Prenatal U, Prenatal Vitamin, PreNatal Vitamins Plus, Prenate Advance, Prenate AM with Quatrefolic, Prenate DHA, Prenate Elite, Prenate   Enhance with Quatrefolic, Prenate Essential, Prenate GT, Prenate Mini, PreNate Plus, Prenate Restore with Quatrefolic, Prenate Star, Prenate Ultra, Prenavite, Prenavite Protein, PreNexa, PreNexa Premier, PrePLUS, PreQue, PreTAB, Previte Rx, PrimaCare, PrimaCare Advantage, PrimaCare ONE, Provida OB, PruEt DHA, PruEt DHAec, PureFe OB Plus, PureFe Plus, PureVit DualFe Plus, RE DualVit OB, RE DualVit Plus, RE OB + DHA, RE OB 90 + DHA, RE Prenatal, RE PreVit + DHA, RE-Nata 29, RE-Nata 29 OB, Reaphrim, Renate, Renate DHA, Renate DHA Extra, REocyte Plus, Right Step, Rovin-Nv, Rovin-Nv DHA, Se-Care, Se-Care Conceive, Se-Care Gesture, Se-Natal 19, Se-Natal 19 Chewable, Se-Natal 90, Se-Natal ONE, Se-Plete DHA, Se-Tan DHA, Se-Tan Plus, Select-OB, Select-OB + DHA, SetonET, SetonET-EC DHA, StrongStart, StrongStart Chewable Tablet, Stuart One, Stuart Prenatal, Stuart Prenatal + DHA, Stuartnatal Plus 3, Tandem DHA, Tandem OB, Tandem Plus, Taron A Prenatal Pack with DHA, Taron EC Calcium DHA Pack, Taron Prenatal with DHA, Taron-C DHA, Taron-EC Cal, Taron-Prex Prenatal with DHA, Thera Natal Complete, Thera Natal Core Nutrition, Thera Natal Lactation Support, Thera Natal OvaVite, Thera Natal Plus, Thera-Tabs, TL-Care DHA, TL-Select, TL-Select DHA, Tri Rx, TriAdvance, TriCare, TriCare Prenatal DHA ONE, Trifera OB, Trimesis Rx, Trinatal GT, Trinatal Rx 1, Trinate, Triveen-Duo DHA, Triveen-PRx RNF, Triveen-Ten, Trust Natal DHA, UltimateCare Advantage, UltimateCare Combo, UltimateCare ONE, UltimateCare ONE NF, Ultra NatalCare, VemaVite-PRx 2, Vena-Bal DHA, Venatal-FA, Verotin-BY, Verotin-GR,  Vinacal, Vinacal B, Vinatal Forte, Vinate 90, Vinate AZ, Vinate AZ Extra, Vinate C, Vinate Calcium, Vinate Care, Vinate DHA, Vinate GT, Vinate IC, Vinate II, Vinate III, Vinate M Low Iron, Vinate One, Vinate PN, Vinate Ultra, Virt Nate, Virt-Advance, Virt-C DHA, Virt-Care One, Virt-PN, Virt-PN DHA, Virt-PN Plus, Virt-Select, Virt-Vite GT, VirtPrex, Vitafol PN, Vitafol Ultra, Vitafol-Nano Prenatal, Vitafol-OB, Vitafol-OB + DHA, Vitafol-OB and DHA, Vitafol-One, VitaMed MD Plus Rx, VitaNatal OB Plus DHA, vitaPearl Prenatal, VitaPhil, VitaPhil + DHA, VitaPhil + DHA 90, VitaPhil AiDE, VitaSpire, Viva CT, VIVA DHA, Vol-Tab Rx, VP CH Ultra, VP-CH-PNV, VP-GGR-B6 Prenatal, VP-HEME OB, VP-HEME OB+ DHA, VP-PNV-DHA, Zatean-CH, Zatean-Pn, Zatean-Pn DHA, Zatean-Pn Plus, Zingiber What should I tell my health care provider before I take this medicine? They need to know if you have any of these conditions: -bleeding or clotting disorder -history of anemia of any type -other chronic health condition -an unusual or allergic reaction to vitamins, minerals, other medicines, foods, dyes, or preservatives How should I use this medicine? Take this medicine by mouth with a glass of water. You can take it with or without food. If it upsets your stomach, take it with food. Chewable prenatal vitamin tablets may be chewed completely before swallowing. Follow the directions on the prescription label. The usual dose is taken once a day. Do not take your medicine more often than directed. Contact your pediatrician regarding the use of this medicine in children. Special care may be needed. This medicine is intended for females who are pregnant, breast-feeding, or may become pregnant. Overdosage: If you think you have taken too much of this medicine contact a poison control center or emergency room at once. NOTE: This medicine is only for you. Do not share this medicine with others. What if I miss a dose? If you miss a dose, take it as  soon as you can. If it is almost time for your next dose, take only that dose. Do not take double or extra doses. What may interact with this medicine? -alendronate -antacids -cefdinir -cefditoren -etidronate -fluoroquinolone antibiotics (examples: ciprofloxacin, gatifloxacin, levofloxacin) -ibandronate -levodopa -risedronate -tetracycline antibiotics (examples: doxycycline, minocycline, tetracycline) -thyroid hormones -warfarin This list may not describe   all possible interactions. Give your health care provider a list of all the medicines, herbs, non-prescription drugs, or dietary supplements you use. Also tell them if you smoke, drink alcohol, or use illegal drugs. Some items may interact with your medicine. What should I watch for while using this medicine? See your health care professional for regular checks on your progress. Remember that vitamin and mineral supplements do not replace the need for good nutrition from a balanced diet. Stools commonly change color when vitamins and minerals are taken. Notify your health care professional if this change is alarming or accompanied by other symptoms, like abdominal pain. What side effects may I notice from receiving this medicine? Side effects that you should report to your doctor or health care professional as soon as possible: -allergic reaction such as skin rash or difficulty breathing -vomiting Side effects that usually do not require medical attention (report to your doctor or health care professional if they continue or are bothersome): -nausea -stomach upset This list may not describe all possible side effects. Call your doctor for medical advice about side effects. You may report side effects to FDA at 1-800-FDA-1088. Where should I keep my medicine? Keep out of the reach of children. Most vitamins and minerals should be stored at controlled room temperature. Check your specific product directions. Protect from heat and moisture.  Throw away any unused medicine after the expiration date. NOTE: This sheet is a summary. It may not cover all possible information. If you have questions about this medicine, talk to your doctor, pharmacist, or health care provider.  2015, [employer]. (2014-01-15 08:34:45)  

## 2015-06-18 NOTE — Progress Notes (Signed)
Subjective:  Meagan Carpenter is a 19 y.o. G3P1011 at [redacted]w[redacted]d being seen today for ongoing prenatal care.  Patient reports no complaints.  Contractions: Not present.  Vag. Bleeding: None. Movement: Present. Denies leaking of fluid.   The following portions of the patient's history were reviewed and updated as appropriate: allergies, current medications, past family history, past medical history, past social history, past surgical history and problem list.   Objective:   Filed Vitals:   06/18/15 1447  BP: 109/71  Pulse: 91  Weight: 118 lb (53.524 kg)    Fetal Status: Fetal Heart Rate (bpm): 150   Movement: Present     General:  Alert, oriented and cooperative. Patient is in no acute distress.  Skin: Skin is warm and dry. No rash noted.   Cardiovascular: Normal heart rate noted  Respiratory: Normal respiratory effort, no problems with respiration noted  Abdomen: Soft, gravid, appropriate for gestational age. Pain/Pressure: Absent     Pelvic: Vag. Bleeding: None Vag D/C Character: Thin   Cervical exam deferred        Extremities: Normal range of motion.  Edema: None  Mental Status: Normal mood and affect. Normal behavior. Normal judgment and thought content.   Urinalysis: Urine Protein: Negative Urine Glucose: Negative  Assessment and Plan:  Pregnancy: G3P1011 at [redacted]w[redacted]d  There are no diagnoses linked to this encounter. Preterm labor symptoms and general obstetric precautions including but not limited to vaginal bleeding, contractions, leaking of fluid and fetal movement were reviewed in detail with the patient. Please refer to After Visit Summary for other counseling recommendations.   Schedule anatomy U/S No Follow-up on file.   Rhea Pink, CNM

## 2015-06-27 ENCOUNTER — Other Ambulatory Visit: Payer: Self-pay | Admitting: Certified Nurse Midwife

## 2015-06-27 ENCOUNTER — Ambulatory Visit (HOSPITAL_COMMUNITY)
Admission: RE | Admit: 2015-06-27 | Discharge: 2015-06-27 | Disposition: A | Discharge status: Home / Self Care | Payer: Medicaid Other | Source: Ambulatory Visit | Attending: Certified Nurse Midwife | Admitting: Certified Nurse Midwife

## 2015-06-27 DIAGNOSIS — Z3A19 19 weeks gestation of pregnancy: Secondary | ICD-10-CM | POA: Insufficient documentation

## 2015-06-27 DIAGNOSIS — Z3689 Encounter for other specified antenatal screening: Secondary | ICD-10-CM

## 2015-06-27 DIAGNOSIS — Z36 Encounter for antenatal screening of mother: Secondary | ICD-10-CM | POA: Diagnosis present

## 2015-06-27 DIAGNOSIS — Z3492 Encounter for supervision of normal pregnancy, unspecified, second trimester: Secondary | ICD-10-CM

## 2015-07-16 ENCOUNTER — Encounter: Payer: Medicaid Other | Admitting: Certified Nurse Midwife

## 2015-07-21 ENCOUNTER — Encounter (HOSPITAL_COMMUNITY): Payer: Self-pay | Admitting: *Deleted

## 2015-07-21 ENCOUNTER — Inpatient Hospital Stay (HOSPITAL_COMMUNITY)
Admission: AD | Admit: 2015-07-21 | Discharge: 2015-07-21 | Disposition: A | Discharge status: Home / Self Care | Payer: Medicaid Other | Source: Ambulatory Visit | Attending: Family Medicine | Admitting: Family Medicine

## 2015-07-21 DIAGNOSIS — O4700 False labor before 37 completed weeks of gestation, unspecified trimester: Secondary | ICD-10-CM

## 2015-07-21 DIAGNOSIS — Z3A23 23 weeks gestation of pregnancy: Secondary | ICD-10-CM | POA: Insufficient documentation

## 2015-07-21 DIAGNOSIS — N898 Other specified noninflammatory disorders of vagina: Secondary | ICD-10-CM | POA: Diagnosis not present

## 2015-07-21 DIAGNOSIS — O368120 Decreased fetal movements, second trimester, not applicable or unspecified: Secondary | ICD-10-CM | POA: Insufficient documentation

## 2015-07-21 LAB — URINALYSIS, ROUTINE W REFLEX MICROSCOPIC
Bilirubin Urine: NEGATIVE
GLUCOSE, UA: 100 mg/dL — AB
HGB URINE DIPSTICK: NEGATIVE
Ketones, ur: NEGATIVE mg/dL
Nitrite: NEGATIVE
PROTEIN: NEGATIVE mg/dL
Specific Gravity, Urine: 1.025 (ref 1.005–1.030)
Urobilinogen, UA: 1 mg/dL (ref 0.0–1.0)
pH: 6 (ref 5.0–8.0)

## 2015-07-21 LAB — WET PREP, GENITAL
CLUE CELLS WET PREP: NONE SEEN
TRICH WET PREP: NONE SEEN
YEAST WET PREP: NONE SEEN

## 2015-07-21 LAB — URINE MICROSCOPIC-ADD ON

## 2015-07-21 NOTE — MAU Note (Signed)
Pt noted abd tightening last night, continues this morning, feeling uc's.  Has felt no FM this a.m.  Has watery discharge this morning, no bleeding.

## 2015-07-21 NOTE — MAU Provider Note (Addendum)
History     CSN: [id number]  Arrival date and time: 07/21/15 0906   None     Chief Complaint  Patient presents with  . Abdominal Pain  . Decreased Fetal Movement   HPI G3P1011 at 23wks seen for abdominal cramping that started last night with decreased movement.  Came in this morning due to continued cramping.  Cramping is intermittent - between 5-15 minutes apart.  Moderate cramping.  Cramping has stopped since being here.  Now has fetal movement.  Also c/o watery discharge intermittently.  No palliating or provoking factors.  OB History    Gravida Para Term Preterm AB TAB SAB Ectopic Multiple Living   3 1 1  0 1 0 1 0 0 1      Past Medical History  Diagnosis Date  . Headache(784.0)   . UTI (lower urinary tract infection)   . Chlamydia     Past Surgical History  Procedure Laterality Date  . Elbow fracture surgery  2012    Family History  Problem Relation Age of Onset  . Heart failure Paternal Grandmother     pacemaker  . Hyperlipidemia Paternal Grandmother   . Hypertension Paternal Grandmother   . Cancer Paternal Grandmother [id number]    great grandmother  . Migraines Paternal Grandmother   . Stroke Paternal Grandfather   . ADD / ADHD Father   . ADD / ADHD Brother   . Autism Other     paternal HaitiGreat Grandmother & Paternal Randie HeinzGreat Aunt had Autism    Social History  Substance Use Topics  . Smoking status: Former Smoker    Types: Cigarettes    Quit date: 01/01/2010  . Smokeless tobacco: Never Used  . Alcohol Use: No    Allergies: No Known Allergies  Prescriptions prior to admission  Medication Sig Dispense Refill Last Dose  . acetaminophen (TYLENOL) 325 MG tablet Take 650 mg by mouth every 6 (six) hours as needed for headache.   Past Week at Unknown time  . guaiFENesin (ROBITUSSIN) 100 MG/5ML liquid Take 200 mg by mouth 3 (three) times daily as needed for cough.   07/20/2015 at Unknown time  . Prenat-FeFum-FePo-FA-Omega 3 (CONCEPT DHA) 53.5-38-1 MG CAPS Take one  by mouth everyday. 30 capsule 12 07/20/2015 at Unknown time    Review of Systems  Constitutional: Negative for fever and chills.  Respiratory: Negative for sputum production, shortness of breath and wheezing.   Gastrointestinal: Negative for nausea, vomiting, abdominal pain, diarrhea and constipation.   Physical Exam   Blood pressure 129/69, pulse 88, temperature 98.1 F (36.7 C), temperature source Oral, resp. rate 18, not currently breastfeeding.  Physical Exam  Constitutional: She appears well-developed and well-nourished.  HENT:  Head: Normocephalic and atraumatic.  Respiratory: Effort normal.  GI: Soft. Bowel sounds are normal. She exhibits no distension and no mass. There is no tenderness. There is no rebound and no guarding.  Genitourinary: There is no rash, tenderness, lesion or injury on the right labia. There is no rash, tenderness, lesion or injury on the left labia. Cervix exhibits no motion tenderness, no discharge and no friability. Right adnexum displays no mass, no tenderness and no fullness. Left adnexum displays no mass, no tenderness and no fullness. No erythema, tenderness or bleeding in the vagina. No foreign body around the vagina. No signs of injury around the vagina. No vaginal discharge found.  Thin whitish discharge Cervix visually closed  Skin: Skin is dry. No rash noted. No erythema. No pallor.  Psychiatric: She has  a normal mood and affect. Her behavior is normal. Judgment and thought content normal.   NST - Reactive  Results for orders placed or performed during the hospital encounter of 07/21/15 (from the past 24 hour(s))  Urinalysis, Routine w reflex microscopic (not at [facility])     Status: Abnormal   Collection Time: 07/21/15  9:22 AM  Result Value Ref Range   Color, Urine YELLOW YELLOW   APPearance CLEAR CLEAR   Specific Gravity, Urine 1.025 1.005 - 1.030   pH 6.0 5.0 - 8.0   Glucose, UA 100 (A) NEGATIVE mg/dL   Hgb urine dipstick NEGATIVE NEGATIVE    Bilirubin Urine NEGATIVE NEGATIVE   Ketones, ur NEGATIVE NEGATIVE mg/dL   Protein, ur NEGATIVE NEGATIVE mg/dL   Urobilinogen, UA 1.0 0.0 - 1.0 mg/dL   Nitrite NEGATIVE NEGATIVE   Leukocytes, UA SMALL (A) NEGATIVE  Urine microscopic-add on     Status: Abnormal   Collection Time: 07/21/15  9:22 AM  Result Value Ref Range   Squamous Epithelial / LPF FEW (A) RARE   WBC, UA 3-6 <3 WBC/hpf   RBC / HPF 0-2 <3 RBC/hpf   Bacteria, UA MANY (A) RARE   Urine-Other MUCOUS PRESENT   Wet prep, genital     Status: Abnormal   Collection Time: 07/21/15 10:15 AM  Result Value Ref Range   Yeast Wet Prep HPF POC NONE SEEN NONE SEEN   Trich, Wet Prep NONE SEEN NONE SEEN   Clue Cells Wet Prep HPF POC NONE SEEN NONE SEEN   WBC, Wet Prep HPF POC MANY (A) NONE SEEN    MAU Course  Procedures  MDM No evidence of BV, yeast, UTI. No contractions here  Assessment and Plan  1.  Threatened preterm Labor -Continue hydration -return if contractions continue 2.  Vaginal discharge - GC/CT pending   STINSON, JACOB JEHIEL 07/21/2015, 10:21 AM

## 2015-07-21 NOTE — Discharge Instructions (Signed)
Safe Medications in Pregnancy   Colds/Coughs/Allergies: Benadryl (alcohol free) 25 mg every 6 hours as needed Breath right strips Claritin Cepacol throat lozenges Chloraseptic throat spray Cold-Eeze- up to three times per day Cough drops, alcohol free Flonase (by prescription only) Guaifenesin Mucinex Robitussin DM (plain only, alcohol free) Saline nasal spray/drops Sudafed (pseudoephedrine) & Actifed ** use only after [redacted] weeks gestation and if you do not have high blood pressure Tylenol Vicks Vaporub Zinc lozenges Zyrtec   Braxton Hicks Contractions Contractions of the uterus can occur throughout pregnancy. Contractions are not always a sign that you are in labor.  WHAT ARE BRAXTON HICKS CONTRACTIONS?  Contractions that occur before labor are called Braxton Hicks contractions, or false labor. Toward the end of pregnancy (32-34 weeks), these contractions can develop more often and may become more forceful. This is not true labor because these contractions do not result in opening (dilatation) and thinning of the cervix. They are sometimes difficult to tell apart from true labor because these contractions can be forceful and people have different pain tolerances. You should not feel embarrassed if you go to the hospital with false labor. Sometimes, the only way to tell if you are in true labor is for your health care provider to look for changes in the cervix. If there are no prenatal problems or other health problems associated with the pregnancy, it is completely safe to be sent home with false labor and await the onset of true labor. HOW CAN YOU TELL THE DIFFERENCE BETWEEN TRUE AND FALSE LABOR? False Labor  The contractions of false labor are usually shorter and not as hard as those of true labor.   The contractions are usually irregular.   The contractions are often felt in the front of the lower abdomen and in the groin.   The contractions may go away when you walk around or  change positions while lying down.   The contractions get weaker and are shorter lasting as time goes on.   The contractions do not usually become progressively stronger, regular, and closer together as with true labor.  True Labor  Contractions in true labor last 30-70 seconds, become very regular, usually become more intense, and increase in frequency.   The contractions do not go away with walking.   The discomfort is usually felt in the top of the uterus and spreads to the lower abdomen and low back.   True labor can be determined by your health care provider with an exam. This will show that the cervix is dilating and getting thinner.  WHAT TO REMEMBER  Keep up with your usual exercises and follow other instructions given by your health care provider.   Take medicines as directed by your health care provider.   Keep your regular prenatal appointments.   Eat and drink lightly if you think you are going into labor.   If Braxton Hicks contractions are making you uncomfortable:   Change your position from lying down or resting to walking, or from walking to resting.   Sit and rest in a tub of warm water.   Drink 2-3 glasses of water. Dehydration may cause these contractions.   Do slow and deep breathing several times an hour.  WHEN SHOULD I SEEK IMMEDIATE MEDICAL CARE? Seek immediate medical care if:  Your contractions become stronger, more regular, and closer together.   You have fluid leaking or gushing from your vagina.   You have a fever.   You pass blood-tinged mucus.  You have vaginal bleeding.   You have continuous abdominal pain.   You have low back pain that you never had before.   You feel your baby's head pushing down and causing pelvic pressure.   Your baby is not moving as much as it used to.    This information is not intended to replace advice given to you by your health care provider. Make sure you discuss any questions  you have with your health care provider.   Document Released: 09/20/2005 Document Revised: 09/25/2013 Document Reviewed: 07/02/2013 Elsevier Interactive Patient Education [employer]2016 Elsevier Inc.

## 2015-07-22 LAB — GC/CHLAMYDIA PROBE AMP (~~LOC~~) NOT AT ARMC
CHLAMYDIA, DNA PROBE: NEGATIVE
Neisseria Gonorrhea: NEGATIVE

## 2015-07-23 IMAGING — US US OB FOLLOW-UP
2 series · 12 of 28 positions shown · non-contrast
Comparison: none

[Series 1: us ob follow up · 10 of 65 slices shown (1 of 2)]
[im 3/65]
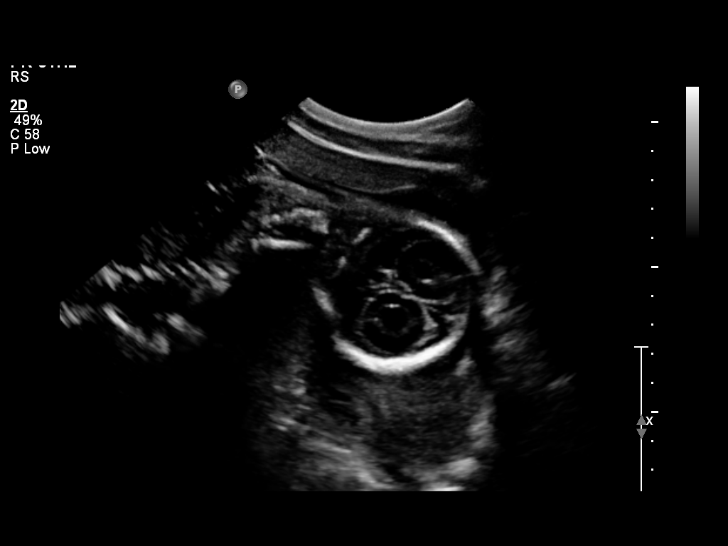
[im 9/65]
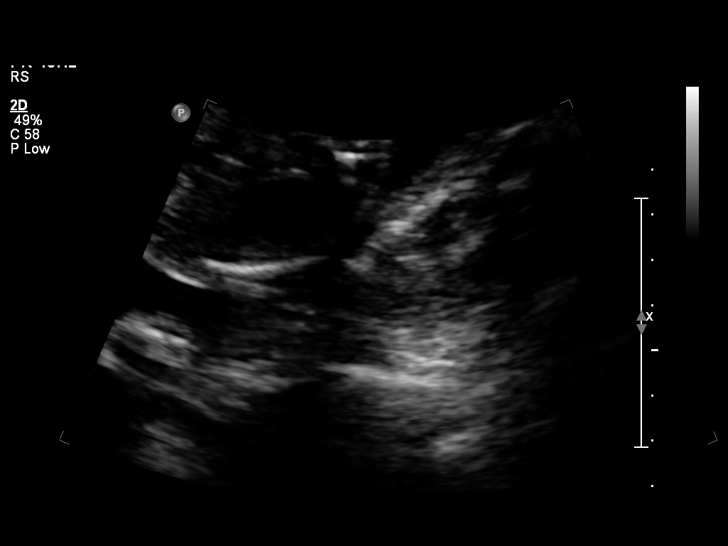
[im 15/65]
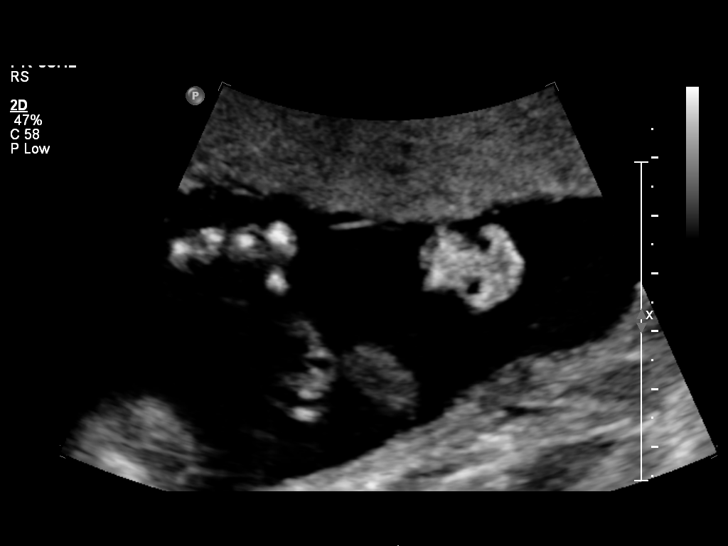
[im 24/65]
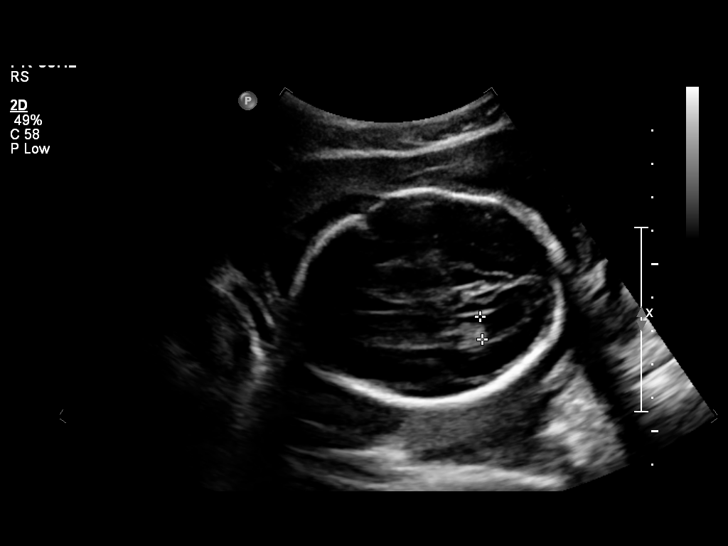
[im 30/65]
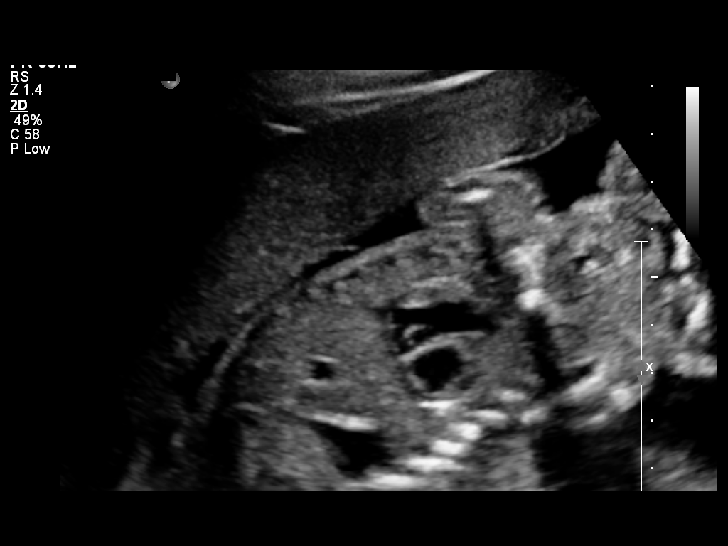
[im 35/65]
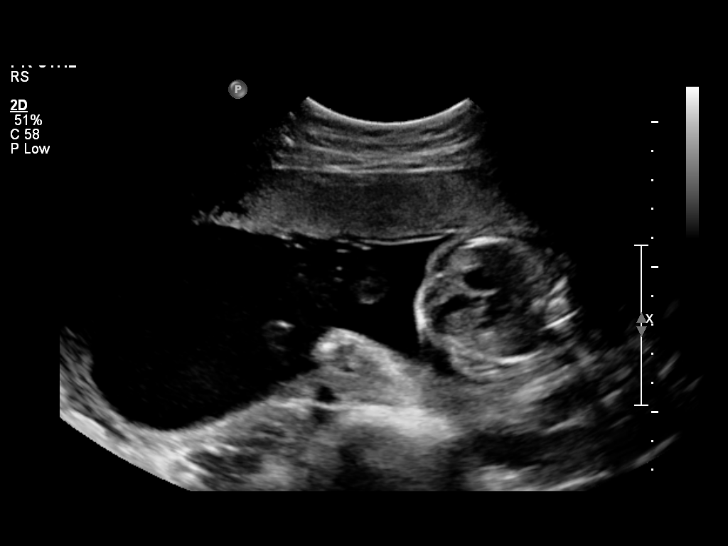
[im 44/65]
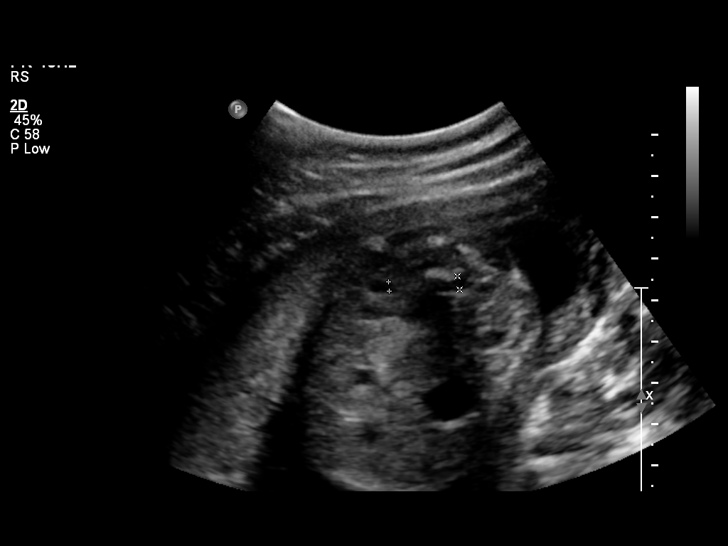
[im 50/65]
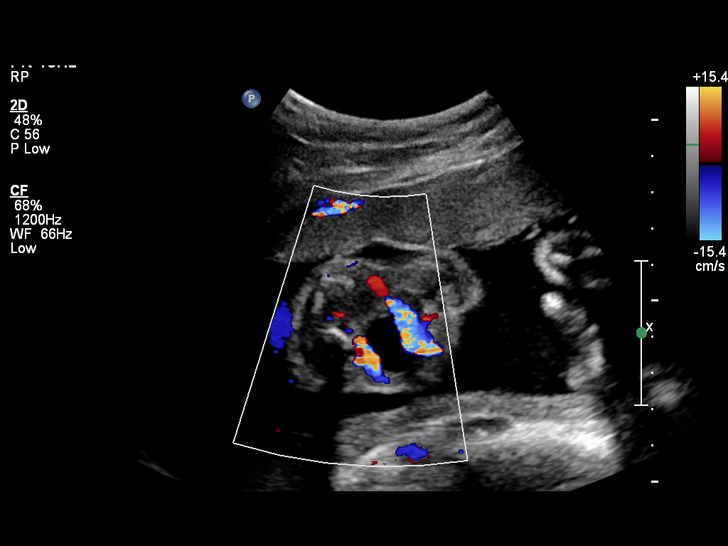
[im 56/65]
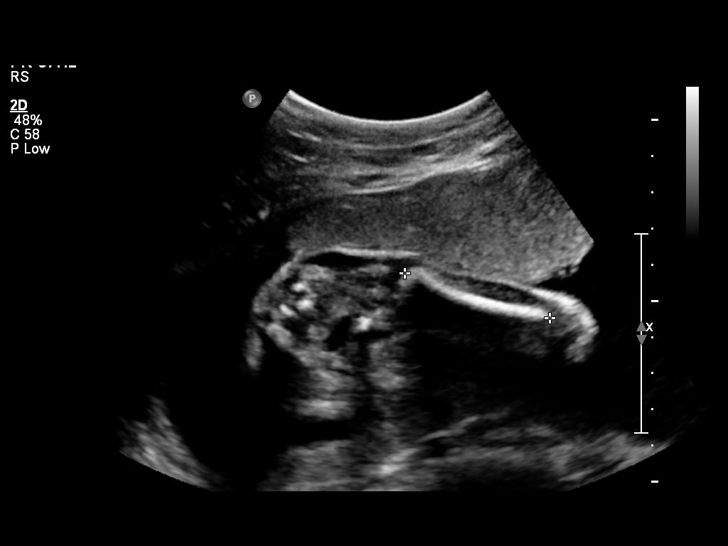
[im 65/65]
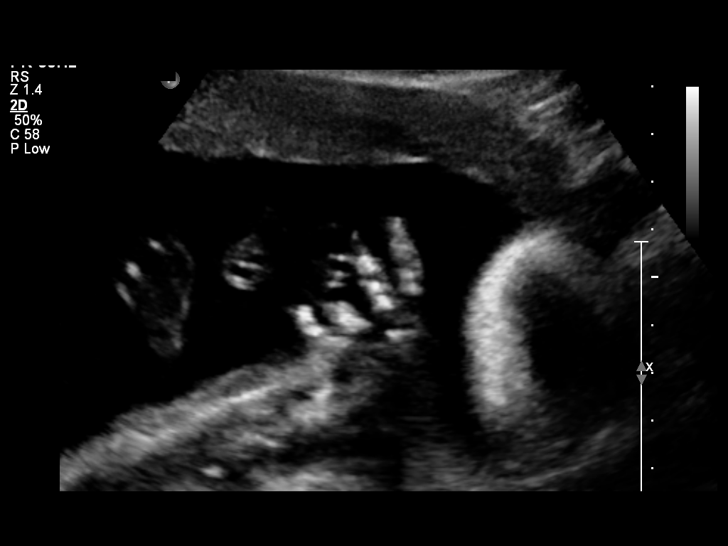

[Series 1: us ob follow up · 2 of 15 slices shown (2 of 2)]
[im 4/15]
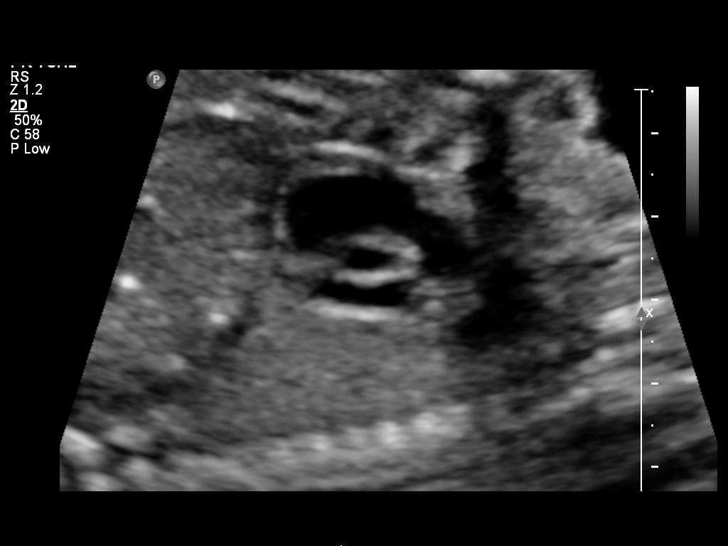
[im 11/15]
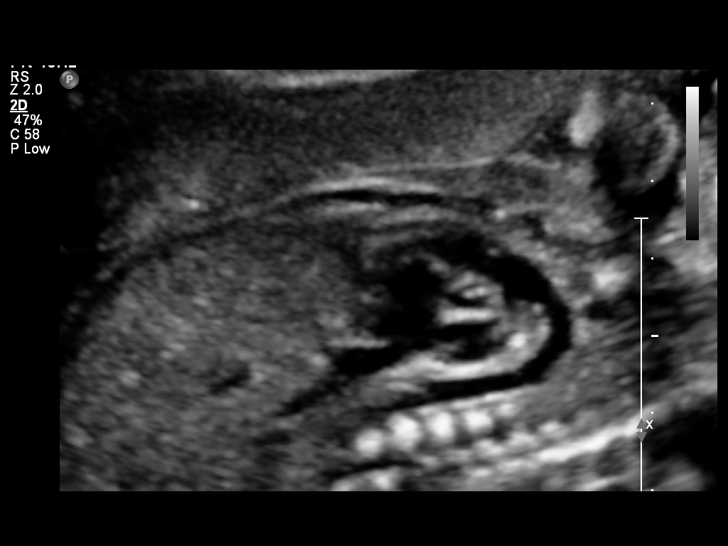

[12 of 28 positions shown; findings below may reference images not displayed]

OBSTETRICS REPORT
                      (Signed Final 04/15/2014 [DATE])

Service(s) Provided

 US OB FOLLOW UP                                       76816.1
Indications

 Follow-up incomplete fetal anatomic evaluation
Fetal Evaluation

 Num Of Fetuses:    1
 Fetal Heart Rate:  147                          bpm
 Cardiac Activity:  Observed

 Amniotic Fluid
 AFI FV:      Subjectively within normal limits
                                             Larg Pckt:     5.9  cm
Biometry

 BPD:     62.4  mm     G. Age:  25w 2d                CI:        74.26   70 - 86
                                                      FL/HC:      18.1   18.7 -

 HC:     229.9  mm     G. Age:  25w 0d       65  %    HC/AC:      1.17   1.05 -

 AC:     197.2  mm     G. Age:  24w 3d       49  %    FL/BPD:     66.5   71 - 87
 FL:      41.5  mm     G. Age:  23w 4d       19  %    FL/AC:      21.0   20 - 24

 Est. FW:     667  gm      1 lb 8 oz     52  %
Gestational Age

 U/S Today:     24w 4d                                        EDD:   08/01/14
 Best:          24w 1d     Det. By:  Early Ultrasound         EDD:   08/04/14
                                     (12/18/13)
Anatomy

 Cranium:          Previously seen        Aortic Arch:      Previously seen
 Fetal Cavum:      Not well visualized    Ductal Arch:      Previously seen
 Ventricles:       Appears normal         Diaphragm:        Appears normal
 Choroid Plexus:   Previously seen        Stomach:          Appears normal, left
                                                            sided
 Cerebellum:       Previously seen        Abdomen:          Appears normal
 Posterior Fossa:  Previously seen        Abdominal Wall:   Previously seen
 Nuchal Fold:      Previously seen        Cord Vessels:     Previously seen
 Face:             Appears normal         Kidneys:          Appear normal
                   (orbits and profile)
 Lips:             Appears normal         Bladder:          Appears normal
 Heart:            Appears normal         Spine:            Previously seen
                   (4CH, axis, and
                   situs)
 RVOT:             Appears normal         Lower             Previously seen
                                          Extremities:
 LVOT:             Appears normal         Upper             Previously seen
                                          Extremities:

 Other:  Male gender. Technically difficult due to fetal position.
Targeted Anatomy

 Fetal Central Nervous System
 Lat. Ventricles:
Cervix Uterus Adnexa

 Cervical Length:    3        cm

 Cervix:       Normal appearance by transabdominal scan.

 Left Ovary:    Size(cm) L: 2.81 x W: 1.97 x H: 1.89  Volume(cc):
 Right Ovary:   Size(cm) L: 3.68 x W: 2.79 x H: 2.59  Volume(cc):

Impression

 SIUP at 23w8d on follow up attempt to complete fetal survey
 EFW 52nd%
 No dysmorphic features demonstrated, noting views
 attempted of the cavum septum pellucidum are not well seen
 No previa
Recommendations

 I would recommend scheduling of the patient in [facility] with
 MFM to facilitate MARITERE bedside assessment of the cavum.
 Please call to schedule, noting recommendation is to
 schedule within 4-6 weeks.

## 2015-07-30 ENCOUNTER — Encounter: Payer: Medicaid Other | Admitting: Certified Nurse Midwife

## 2015-08-05 ENCOUNTER — Encounter: Payer: Medicaid Other | Admitting: Family Medicine

## 2015-08-06 ENCOUNTER — Ambulatory Visit (INDEPENDENT_AMBULATORY_CARE_PROVIDER_SITE_OTHER): Payer: Medicaid Other | Admitting: Certified Nurse Midwife

## 2015-08-06 ENCOUNTER — Encounter: Payer: Self-pay | Admitting: *Deleted

## 2015-08-06 VITALS — BP 113/73 | HR 77 | Wt 127.0 lb

## 2015-08-06 DIAGNOSIS — Z3492 Encounter for supervision of normal pregnancy, unspecified, second trimester: Secondary | ICD-10-CM

## 2015-08-06 DIAGNOSIS — Z23 Encounter for immunization: Secondary | ICD-10-CM | POA: Diagnosis not present

## 2015-08-06 DIAGNOSIS — Z3482 Encounter for supervision of other normal pregnancy, second trimester: Secondary | ICD-10-CM

## 2015-08-06 NOTE — Progress Notes (Signed)
Subjective:  Meagan Carpenter is a 19 y.o. G3P1011 at 406w2d being seen today for ongoing prenatal care.  Patient reports no complaints.  Contractions: Irregular.  Vag. Bleeding: None. Movement: Present. Denies leaking of fluid.   The following portions of the patient's history were reviewed and updated as appropriate: allergies, current medications, past family history, past medical history, past social history, past surgical history and problem list. Problem list updated.  Objective:   Filed Vitals:   08/06/15 1345  BP: 113/73  Pulse: 77  Weight: 127 lb (57.607 kg)    Fetal Status: Fetal Heart Rate (bpm): 143 Fundal Height: 25 cm Movement: Present     General:  Alert, oriented and cooperative. Patient is in no acute distress.  Skin: Skin is warm and dry. No rash noted.   Cardiovascular: Normal heart rate noted  Respiratory: Normal respiratory effort, no problems with respiration noted  Abdomen: Soft, gravid, appropriate for gestational age. Pain/Pressure: Absent     Pelvic: Vag. Bleeding: None Vag D/C Character: Thin   Cervical exam deferred        Extremities: Normal range of motion.  Edema: None  Mental Status: Normal mood and affect. Normal behavior. Normal judgment and thought content.   Urinalysis:      Assessment and Plan:  Pregnancy: G3P1011 at 716w2d  There are no diagnoses linked to this encounter. Preterm labor symptoms and general obstetric precautions including but not limited to vaginal bleeding, contractions, leaking of fluid and fetal movement were reviewed in detail with the patient. Please refer to After Visit Summary for other counseling recommendations.  Return in about 3 weeks (around 08/27/2015) for 1 hour gtt. Note to not lift more than 20 pounds at work  [employer]elic[employer] A Dorothe Elmore, CNM

## 2015-08-06 NOTE — Progress Notes (Signed)
Pt c/o contractions that occurs with activity and while at work, states they go away when she gets home and rest.

## 2015-08-06 NOTE — Patient Instructions (Signed)
Glucose Tolerance Test The glucose tolerance test (GTT) is one of several tests used to diagnose diabetes mellitus. The GTT is a blood test, and it may include a urine test as well. The GTT checks to see how your body processes sugar (glucose). For this test, you will consume a drink containing a high level of glucose. Your blood glucose levels will be checked before you consume the drink and then again 1, 2, 3, and possibly 4 hours after you consume it. Your health care provider may recommend that you have the GTT if you:  Have a family history of diabetes.   Are very overweight (obese).   Have experienced infections that keep coming back.   Have had numerous cuts or wounds that did not heal quickly, especially on your legs and feet.   Are a woman and have a history of giving birth to very large babies or a history of repeated fetal loss (stillbirth).  Have had glucose in your urine or high blood sugar:   During pregnancy.   After a heart attack, surgery, or prolonged periods of high stress.  The GTT lasts 3-4 hours. Other than the glucose solution, you will not be allowed to eat or drink anything during the test. You must remain at the testing location to make sure that your blood and urine samples are taken on time. PREPARATION FOR TEST Eat normally for 3 days prior to the GTT test, including having plenty of carbohydrate-rich foods. Do not eat or drink anything except water during the final 12 hours before the test. You should not smoke or exercise during the test. In addition, your health care provider may ask you to stop taking certain medicines before the test. RESULTS It is your responsibility to obtain your test results. Ask the lab or department performing the test when and how you will get your results. Contact your health care provider to discuss any questions you have about your results. Range of Normal Values Ranges for normal values may vary among different labs and  hospitals. You should always check with your health care provider after having lab work or other tests done to discuss whether your values are considered within normal limits.  Normal levels of blood glucose are as follows:  Fasting: less than 110 mg/dL or less than 6.1 mmol/L (SI units).  1 hour after consuming the glucose drink: less than 200 mg/dL or less than 11.1 mmol/L.  2 hours after consuming the glucose drink: less than 140 mg/dL or less than 7.8 mmol/L.  3 hours after consuming the glucose drink: 70-115 mg/dL or less than 6.4 mmol/L.  4 hours after consuming the glucose drink: 70-115 mg/dL or less than 6.4 mmol/L. The normal result for the urine test is negative, meaning that glucose is absent from your urine. Some substances can interfere with GTT results. These may include:  Blood pressure and heart failure medicines, including beta blockers, furosemide, and thiazides.   Anti-inflammatory medicines, including aspirin.   Nicotine.   Some psychiatric medicines.   Oral contraceptives.   Diuretics or corticosteroids. Meaning of Results Outside Normal Value Ranges GTT test results that are above normal values may indicate health problems, such as:  Diabetes mellitus.   Acute stress response.   Cushing syndrome.   Tumors such as pheochromocytoma or glucagonoma.   Chronic renal failure.   Pancreatitis.   Hyperthyroidism.   Current infection.  Discuss your test results with your health care provider. He or she will use the results   to make a diagnosis and determine a treatment plan that is right for you.   This information is not intended to replace advice given to you by your health care provider. Make sure you discuss any questions you have with your health care provider.   Document Released: 10/13/2004 Document Revised: 10/11/2014 Document Reviewed: 01/25/2014 Elsevier Interactive Patient Education 2016 Elsevier Inc.  

## 2015-08-17 IMAGING — US US OB FOLLOW-UP
1 series · 12 of 28 positions shown · non-contrast
Comparison: none

[Series 1: us ob follow up · 44 acquisitions, 12 frames shown]
[im 2/44]
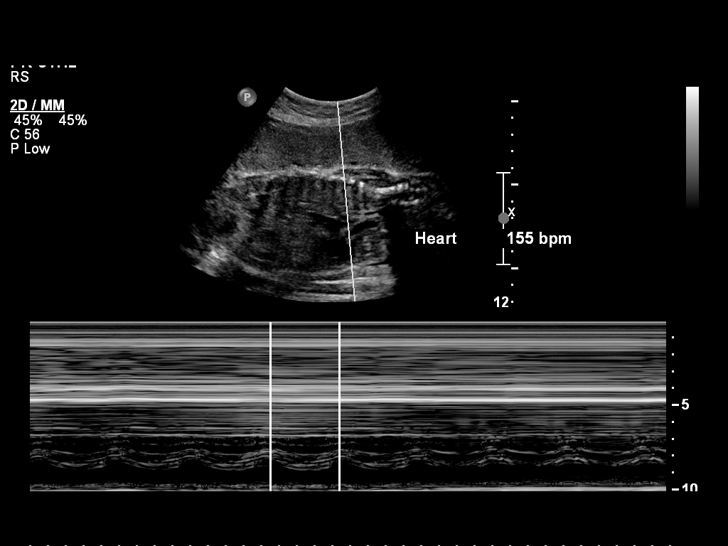
[im 5/44]
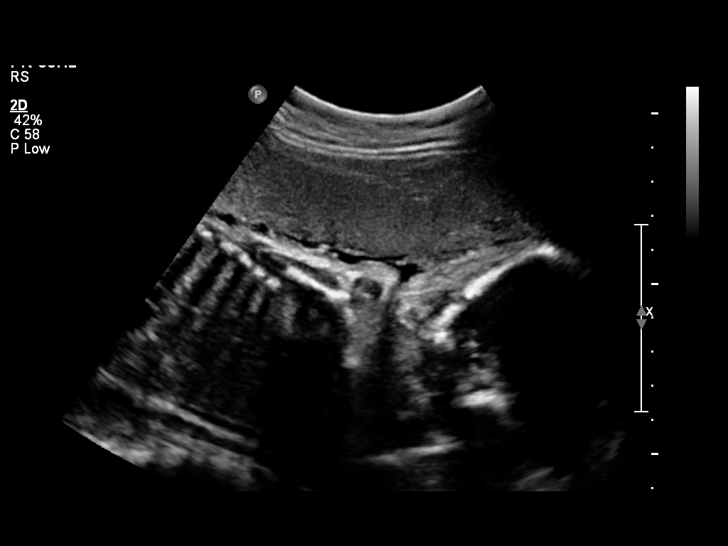
[im 8/44]
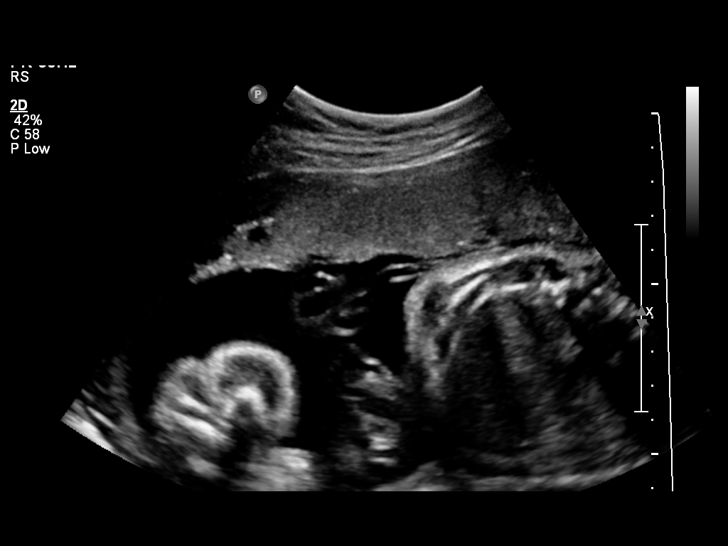
[im 13/44]
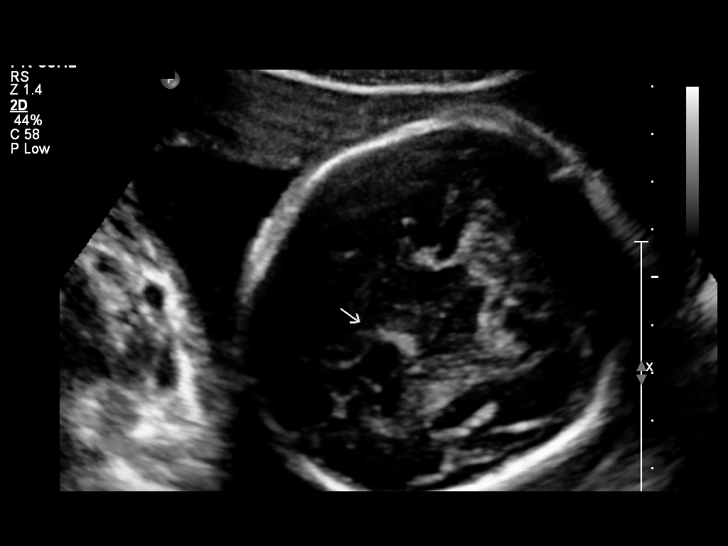
[im 16/44]
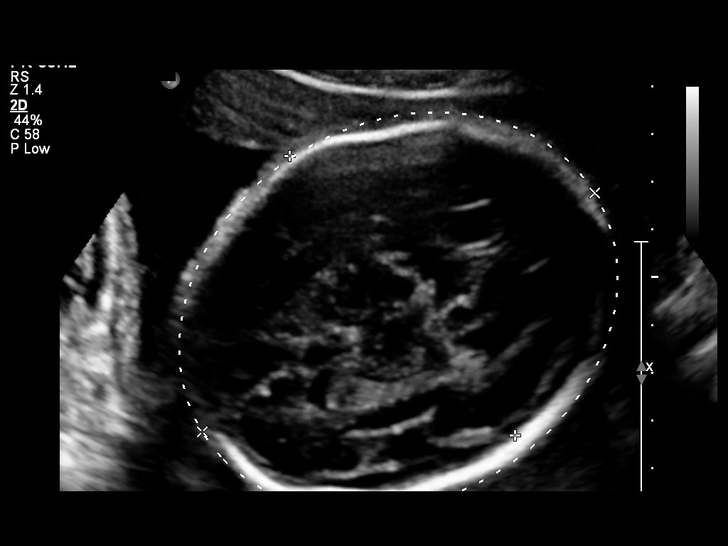
[im 20/44]
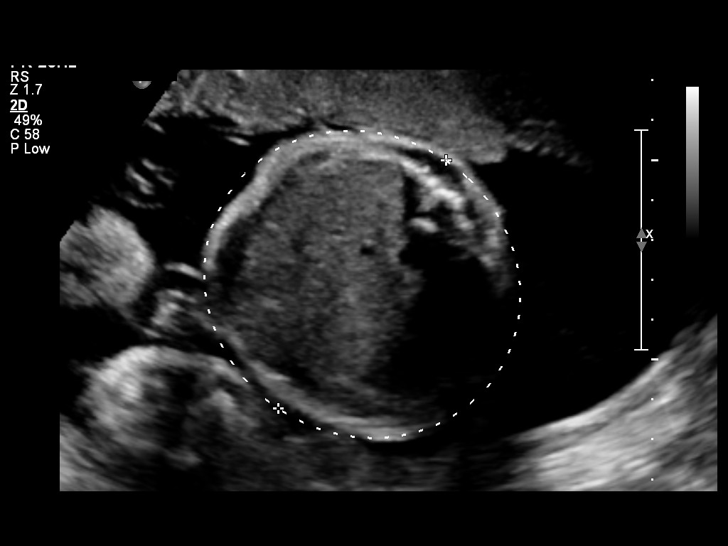
[im 24/44]
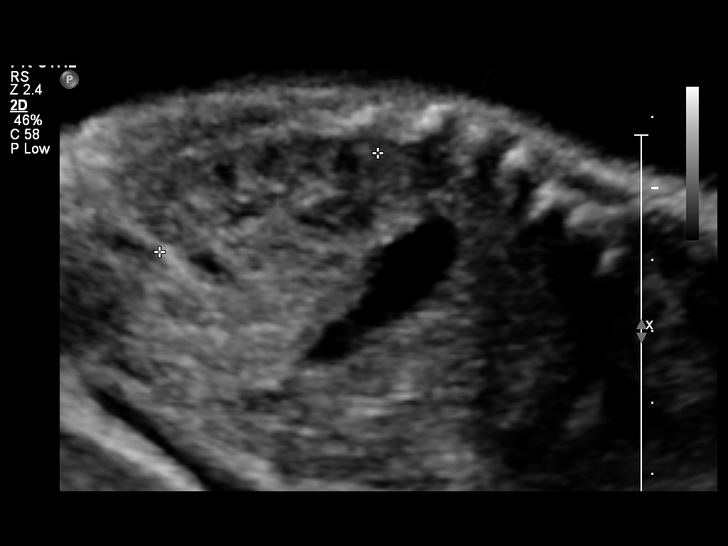
[im 28/44]
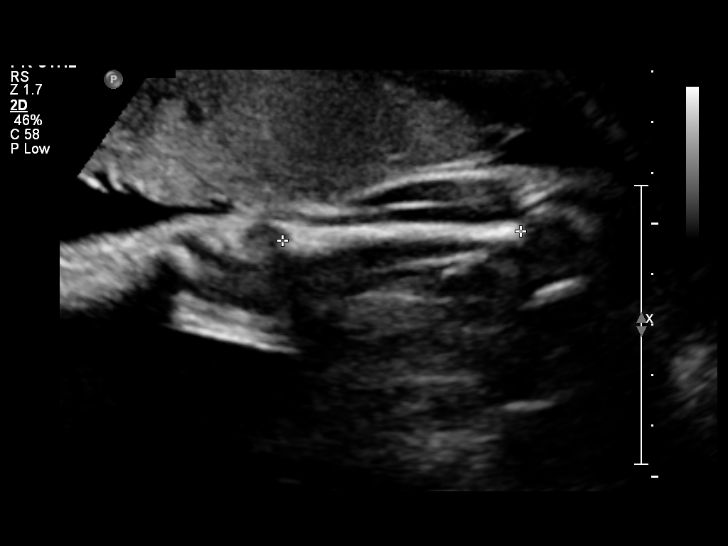
[im 31/44]
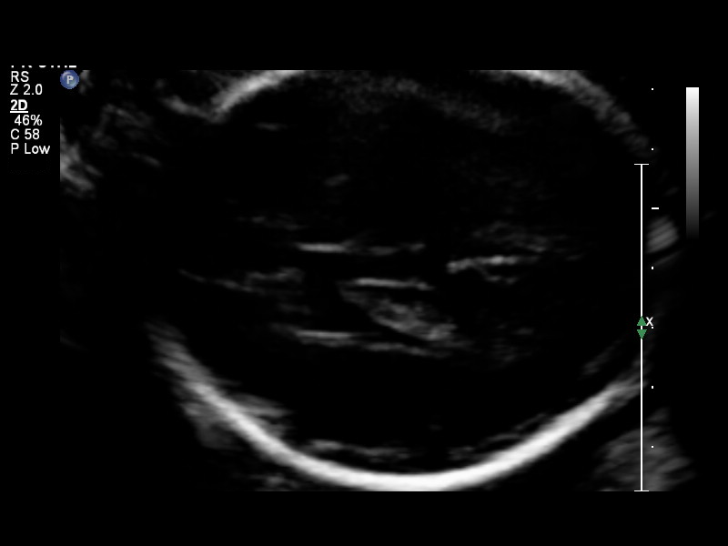
[im 36/44]
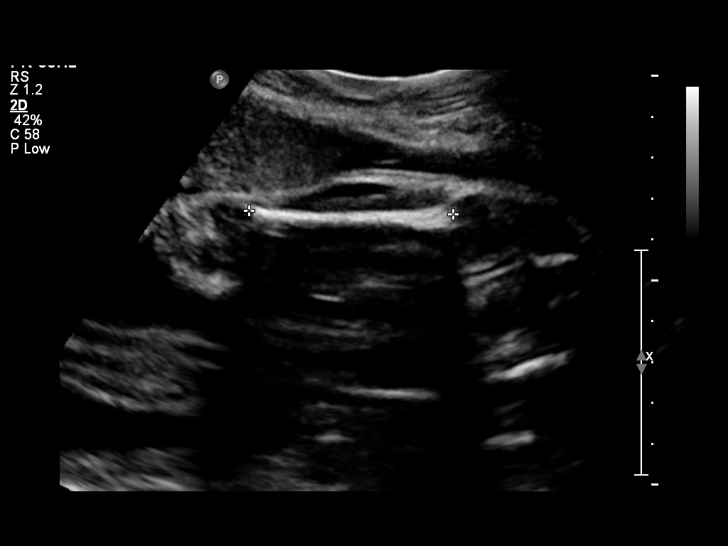
[im 39/44]
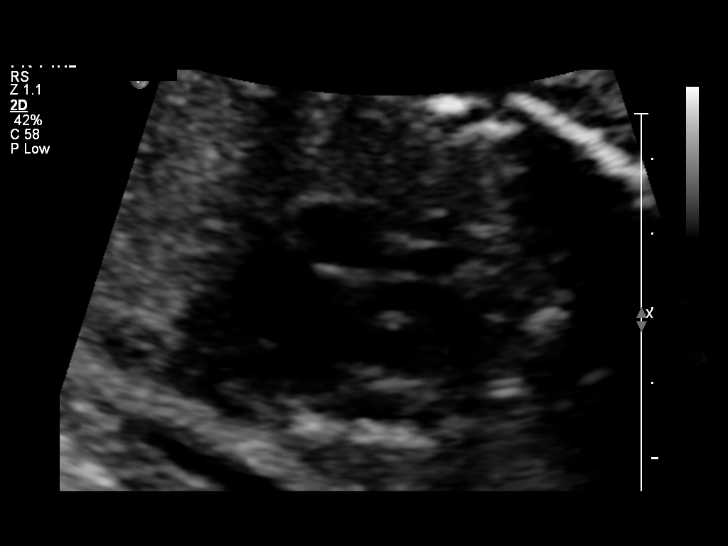
[im 42/44]
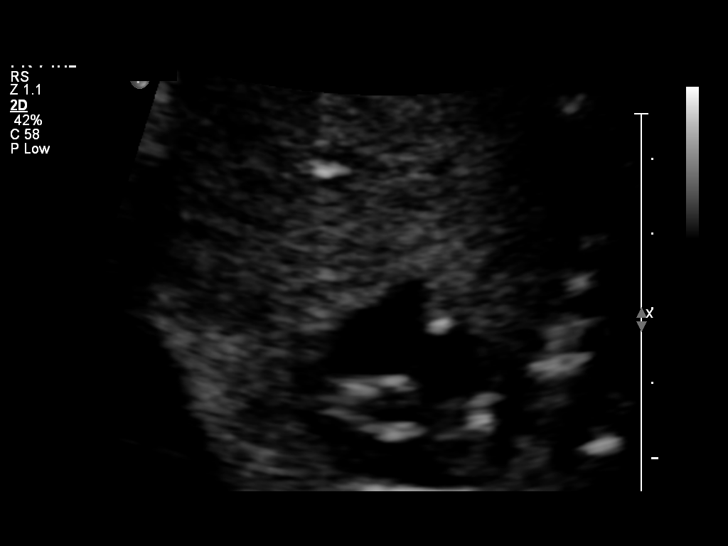

[12 of 28 positions shown; findings below may reference images not displayed]

OBSTETRICS REPORT
                      (Signed Final 05/10/2014 [DATE])

Service(s) Provided

 US OB FOLLOW UP                                       76816.1
Indications

 Follow-up incomplete fetal anatomic evaluation
Fetal Evaluation

 Num Of Fetuses:    1
 Fetal Heart Rate:  155                          bpm
 Cardiac Activity:  Observed
 Presentation:      Cephalic
 Placenta:          Anterior, above cervical os
 P. Cord            Visualized, central
 Insertion:

 Amniotic Fluid
 AFI FV:      Subjectively within normal limits
                                             Larg Pckt:     5.1  cm
Biometry

 BPD:     75.1  mm     G. Age:  30w 1d                CI:        76.42   70 - 86
                                                      FL/HC:      18.1   18.8 -

 HC:     272.2  mm     G. Age:  29w 5d       81  %    HC/AC:      1.13   1.05 -

 AC:     240.5  mm     G. Age:  28w 2d       61  %    FL/BPD:     65.8   71 - 87
 FL:      49.4  mm     G. Age:  26w 5d       11  %    FL/AC:      20.5   20 - 24

 Est. FW:    4444  gm      2 lb 9 oz     58  %
Gestational Age

 U/S Today:     28w 5d                                        EDD:   07/28/14
 Best:          27w 5d     Det. By:  Early Ultrasound         EDD:   08/04/14
                                     (12/18/13)
Anatomy

 Cranium:          Appears normal         Aortic Arch:      Previously seen
 Fetal Cavum:      Appears normal         Ductal Arch:      Previously seen
 Ventricles:       Appears normal         Diaphragm:        Appears normal
 Choroid Plexus:   Previously seen        Stomach:          Appears normal, left
                                                            sided
 Cerebellum:       Previously seen        Abdomen:          Appears normal
 Posterior Fossa:  Previously seen        Abdominal Wall:   Previously seen
 Nuchal Fold:      Previously seen        Cord Vessels:     Previously seen
 Face:             Orbits and profile     Kidneys:          Appear normal
                   previously seen
 Lips:             Previously seen        Bladder:          Appears normal
 Heart:            Appears normal         Spine:            Previously seen
                   (4CH, axis, and
                   situs)
 RVOT:             Appears normal         Lower             Previously seen
                                          Extremities:
 LVOT:             Appears normal         Upper             Previously seen
                                          Extremities:

 Other:  Male gender. Technically difficult due to fetal position.
Cervix Uterus Adnexa

 Cervical Length:    2.9      cm

 Cervix:       Normal appearance by transabdominal scan.
Impression

 SIUP at 27+5 weeks
 Normal interval anatomy; anatomic survey complete; CSP
 seen today and appeared normal
 Normal amniotic fluid volume
 Appropriate interval growth with EFW at the 58th %tile
Recommendations

 Follow-up as clinically indicated

## 2015-08-27 ENCOUNTER — Encounter: Payer: Medicaid Other | Admitting: Obstetrics & Gynecology

## 2015-09-02 ENCOUNTER — Telehealth: Payer: Self-pay | Admitting: *Deleted

## 2015-09-02 NOTE — Telephone Encounter (Signed)
Pt missed her 28 wk appt on 08-27-15, attempted to reschedule pt the following week of 09-01-15, pt declined and stated she would be unable to make it this week, scheduled her an appt on 09-10-15.  Will need 1 hr GTT and labs that day.

## 2015-09-10 ENCOUNTER — Encounter: Payer: Medicaid Other | Admitting: Obstetrics & Gynecology

## 2015-09-10 ENCOUNTER — Encounter: Payer: Medicaid Other | Admitting: Certified Nurse Midwife

## 2015-09-15 ENCOUNTER — Encounter: Payer: Medicaid Other | Admitting: Family Medicine

## 2015-09-17 ENCOUNTER — Ambulatory Visit (INDEPENDENT_AMBULATORY_CARE_PROVIDER_SITE_OTHER): Payer: Medicaid Other | Admitting: Certified Nurse Midwife

## 2015-09-17 VITALS — BP 118/79 | HR 116 | Wt 138.0 lb

## 2015-09-17 DIAGNOSIS — Z23 Encounter for immunization: Secondary | ICD-10-CM | POA: Diagnosis not present

## 2015-09-17 DIAGNOSIS — Z36 Encounter for antenatal screening of mother: Secondary | ICD-10-CM

## 2015-09-17 DIAGNOSIS — Z3493 Encounter for supervision of normal pregnancy, unspecified, third trimester: Secondary | ICD-10-CM

## 2015-09-17 DIAGNOSIS — Z3483 Encounter for supervision of other normal pregnancy, third trimester: Secondary | ICD-10-CM

## 2015-09-17 DIAGNOSIS — B360 Pityriasis versicolor: Secondary | ICD-10-CM

## 2015-09-17 LAB — CBC
HCT: 29.7 % — ABNORMAL LOW (ref 36.0–46.0)
Hemoglobin: 10 g/dL — ABNORMAL LOW (ref 12.0–15.0)
MCH: 27.9 pg (ref 26.0–34.0)
MCHC: 33.7 g/dL (ref 30.0–36.0)
MCV: 82.7 fL (ref 78.0–100.0)
MPV: 10.6 fL (ref 8.6–12.4)
Platelets: 263 10*3/uL (ref 150–400)
RBC: 3.59 MIL/uL — ABNORMAL LOW (ref 3.87–5.11)
RDW: 13.6 % (ref 11.5–15.5)
WBC: 8.7 10*3/uL (ref 4.0–10.5)

## 2015-09-17 MED ORDER — CLOTRIMAZOLE 1 % EX CREA: 1.0000 "application " | TOPICAL_CREAM | Freq: Two times a day (BID) | CUTANEOUS | Status: DC

## 2015-09-17 NOTE — Patient Instructions (Addendum)
Third Trimester of Pregnancy The third trimester is from week 29 through week 42, months 7 through 9. This trimester is when your unborn baby (fetus) is growing very fast. At the end of the ninth month, the unborn baby is about 20 inches in length. It weighs about 6-10 pounds.  HOME CARE   Avoid all smoking, herbs, and alcohol. Avoid drugs not approved by your doctor.  Do not use any tobacco products, including cigarettes, chewing tobacco, and electronic cigarettes. If you need help quitting, ask your doctor. You may get counseling or other support to help you quit.  Only take medicine as told by your doctor. Some medicines are safe and some are not during pregnancy.  Exercise only as told by your doctor. Stop exercising if you start having cramps.  Eat regular, healthy meals.  Wear a good support bra if your breasts are tender.  Do not use hot tubs, steam rooms, or saunas.  Wear your seat belt when driving.  Avoid raw meat, uncooked cheese, and liter boxes and soil used by cats.  Take your prenatal vitamins.  Take 1500-2000 milligrams of calcium daily starting at the 20th week of pregnancy until you deliver your baby.  Try taking medicine that helps you poop (stool softener) as needed, and if your doctor approves. Eat more fiber by eating fresh fruit, vegetables, and whole grains. Drink enough fluids to keep your pee (urine) clear or pale yellow.  Take warm water baths (sitz baths) to soothe pain or discomfort caused by hemorrhoids. Use hemorrhoid cream if your doctor approves.  If you have puffy, bulging veins (varicose veins), wear support hose. Raise (elevate) your feet for 15 minutes, 3-4 times a day. Limit salt in your diet.  Avoid heavy lifting, wear low heels, and sit up straight.  Rest with your legs raised if you have leg cramps or low back pain.  Visit your dentist if you have not gone during your pregnancy. Use a soft toothbrush to brush your teeth. Be gentle when you  floss.  You can have sex (intercourse) unless your doctor tells you not to.  Do not travel far distances unless you must. Only do so with your doctor's approval.  Take prenatal classes.  Practice driving to the hospital.  Pack your hospital bag.  Prepare the baby's room.  Go to your doctor visits. GET HELP IF:  You are not sure if you are in labor or if your water has broken.  You are dizzy.  You have mild cramps or pressure in your lower belly (abdominal).  You have a nagging pain in your belly area.  You continue to feel sick to your stomach (nauseous), throw up (vomit), or have watery poop (diarrhea).  You have bad smelling fluid coming from your vagina.  You have pain with peeing (urination). GET HELP RIGHT AWAY IF:   You have a fever.  You are leaking fluid from your vagina.  You are spotting or bleeding from your vagina.  You have severe belly cramping or pain.  You lose or gain weight rapidly.  You have trouble catching your breath and have chest pain.  You notice sudden or extreme puffiness (swelling) of your face, hands, ankles, feet, or legs.  You have not felt the baby move in over an hour.  You have severe headaches that do not go away with medicine.  You have vision changes.   This information is not intended to replace advice given to you by your health care   provider. Make sure you discuss any questions you have with your health care provider.   Document Released: 12/15/2009 Document Revised: 10/11/2014 Document Reviewed: 11/21/2012 Elsevier Interactive Patient Education 2016 Elsevier Inc. Tinea Versicolor Tinea versicolor is a skin infection that is caused by a type of yeast. It causes a rash that shows up as light or dark patches on the skin. It often occurs on the chest, back, neck, or upper arms. The condition usually does not cause other problems. In most cases, it goes away in a few weeks with treatment. The infection cannot be spread by  person to another person. HOME CARE  Take medicines only as told by your doctor.  Scrub your skin every day with a dandruff shampoo as told by your doctor.  Do not scratch your skin in the rash area.  Avoid places that are hot and humid.  Do not use tanning booths.  Try to avoid sweating a lot. GET HELP IF:  Your symptoms get worse.  You have a fever.  You have redness, swelling, or pain in the area of your rash.  You have fluid, blood, or pus coming from your rash.  Your rash comes back after treatment.   This information is not intended to replace advice given to you by your health care provider. Make sure you discuss any questions you have with your health care provider.   Document Released: 09/02/2008 Document Revised: 10/11/2014 Document Reviewed: 07/02/2014 Elsevier Interactive Patient Education [employer] Elsevier Inc.

## 2015-09-17 NOTE — Progress Notes (Signed)
Subjective:  Townsend Rogerlexis R Lathon is a 19 y.o. G3P1011 at 6657w2d being seen today for ongoing prenatal care.  She is currently monitored for the following issues for this low-risk pregnancy and has Tension headache; Migraine headache without aura; Supervision of normal pregnancy, antepartum; and Tinea versicolor on her problem list.  Patient reports rash.  Contractions: Irregular. Vag. Bleeding: None.  Movement: Present. Denies leaking of fluid.   The following portions of the patient's history were reviewed and updated as appropriate: allergies, current medications, past family history, past medical history, past social history, past surgical history and problem list. Problem list updated.  Objective:   Filed Vitals:   09/17/15 1316  BP: 118/79  Pulse: 116  Weight: 138 lb (62.596 kg)    Fetal Status: Fetal Heart Rate (bpm): 160   Movement: Present     General:  Alert, oriented and cooperative. Patient is in no acute distress.  Skin: Skin is warm and dry. No rash noted.   Cardiovascular: Normal heart rate noted  Respiratory: Normal respiratory effort, no problems with respiration noted  Abdomen: Soft, gravid, appropriate for gestational age. Pain/Pressure: Present     Pelvic: Vag. Bleeding: None Vag D/C Character: Thin   Cervical exam deferred        Extremities: Normal range of motion.  Edema: None  Mental Status: Normal mood and affect. Normal behavior. Normal judgment and thought content.   Urinalysis: Urine Protein: Negative Urine Glucose: Negative  Assessment and Plan:  Pregnancy: G3P1011 at 4757w2d  1. Supervision of normal pregnancy in third trimester  - Tdap vaccine greater than or equal to 7yo IM - Glucose Tolerance, 1 HR (50g) w/o Fasting - HIV antibody (with reflex) - RPR - CBC  2. Supervision of normal pregnancy, antepartum, third trimester   3. Tinea versicolor Clotrimazole topical BID  Preterm labor symptoms and general obstetric precautions including but not  limited to vaginal bleeding, contractions, leaking of fluid and fetal movement were reviewed in detail with the patient. Please refer to After Visit Summary for other counseling recommendations.  Return in about 2 weeks (around 10/01/2015).   Rhea PinkLori A Clemmons, CNM

## 2015-09-18 LAB — RPR

## 2015-09-18 LAB — HIV ANTIBODY (ROUTINE TESTING W REFLEX): HIV 1&2 Ab, 4th Generation: NONREACTIVE

## 2015-09-18 LAB — GLUCOSE TOLERANCE, 1 HOUR (50G) W/O FASTING: Glucose, 1 Hour GTT: 87 mg/dL (ref 70–140)

## 2015-10-01 ENCOUNTER — Encounter: Payer: Medicaid Other | Admitting: Certified Nurse Midwife

## 2015-10-05 NOTE — L&D Delivery Note (Signed)
Patient is 20 y.o. G3P1011 [redacted]w[redacted]d admitted for SOL.   Delivery Note At 9:47 PM a viable and healthy female was delivered via Vaginal, Spontaneous Delivery (Presentation: Left Occiput Anterior).  APGAR: 9, 9; weight pending  .   Placenta status: Intact, Spontaneous.  Cord: 3 vessels with the following complications: None.  Anesthesia: Epidural  Episiotomy: None Lacerations:  Vaginal abrasion Suture Repair: none Est. Blood Loss (mL): 250  Mom to postpartum.  Baby to Couplet care / Skin to Skin.  Amber Heckart 11/05/2015, 10:10 PM      Upon arrival patient was complete and pushing. She pushed with good maternal effort to deliver a healthy baby girl. Baby delivered without difficulty, was noted to have good tone and place on maternal abdomen for oral suctioning, drying and stimulation. Delayed cord clamping performed. Placenta delivered intact with 3V cord. Vaginal canal and perineum was inspected and first degree did not require repair; hemostatic. Pitocin was started and uterus massaged until bleeding slowed. Counts of sharps, instruments, and lap pads were all correct.   Wynne Dust, MD, PGY-1  Patient is a [id number] at [redacted]w[redacted]d who was admitted in early active labor, uncomplicated prenatal course.  She progressed without augmentation.  I was gloved and present for delivery in its entirety.  Second stage of labor progressed to SVD;  Some early decels during second stage noted.  Complications: none  Lacerations: sm abrasion at base of introitus- no repair  EBL: 250cc  Cam Hai, CNM 10:59 PM  11/05/2015

## 2015-10-13 ENCOUNTER — Telehealth: Payer: Self-pay | Admitting: *Deleted

## 2015-10-13 NOTE — Telephone Encounter (Signed)
[facility] and was discharged to home, [facility] to verify pt phone number, Dr. Nedra HaiLee was calling to follow-up with the pt.

## 2015-10-14 ENCOUNTER — Encounter: Payer: Medicaid Other | Admitting: Family Medicine

## 2015-10-15 ENCOUNTER — Ambulatory Visit (INDEPENDENT_AMBULATORY_CARE_PROVIDER_SITE_OTHER): Payer: Medicaid Other | Admitting: Certified Nurse Midwife

## 2015-10-15 VITALS — BP 130/81 | HR 116 | Wt 147.0 lb

## 2015-10-15 DIAGNOSIS — O368130 Decreased fetal movements, third trimester, not applicable or unspecified: Secondary | ICD-10-CM

## 2015-10-15 DIAGNOSIS — O368131 Decreased fetal movements, third trimester, fetus 1: Secondary | ICD-10-CM

## 2015-10-15 DIAGNOSIS — Z3483 Encounter for supervision of other normal pregnancy, third trimester: Secondary | ICD-10-CM

## 2015-10-15 NOTE — Progress Notes (Signed)
Reports decreased fetal movement- Reactive NST in the office today Subjective:  Meagan Carpenter is a 20 y.o. G3P1011 at 7849w2d being seen today for ongoing prenatal care.  She is currently monitored for the following issues for this low-risk pregnancy and has Tension headache; Migraine headache without aura; Supervision of normal pregnancy, antepartum; and Tinea versicolor on her problem list.  Patient reports contractions since last night.  Contractions: Irregular. Vag. Bleeding: None.  Movement: Present. Denies leaking of fluid.   The following portions of the patient's history were reviewed and updated as appropriate: allergies, current medications, past family history, past medical history, past social history, past surgical history and problem list. Problem list updated.  Objective:   Filed Vitals:   10/15/15 1323  BP: 130/81  Pulse: 116  Weight: 147 lb (66.679 kg)    Fetal Status: Fetal Heart Rate (bpm): 160 Fundal Height: 34 cm Movement: Present  Presentation: Vertex  General:  Alert, oriented and cooperative. Patient is in no acute distress.  Skin: Skin is warm and dry. No rash noted.   Cardiovascular: Normal heart rate noted  Respiratory: Normal respiratory effort, no problems with respiration noted  Abdomen: Soft, gravid, appropriate for gestational age. Pain/Pressure: Present     Pelvic: Vag. Bleeding: None Vag D/C Character: Thin   Cervical exam performed Dilation: 3 Effacement (%): 50 Station: -2  Extremities: Normal range of motion.  Edema: None  Mental Status: Normal mood and affect. Normal behavior. Normal judgment and thought content.   Urinalysis: Urine Protein: Negative Urine Glucose: Negative  Assessment and Plan:  Pregnancy: G3P1011 at 4549w2d  1. Supervision of normal pregnancy, antepartum, third trimester \ - Fetal nonstress test  2. Decreased fetal movement, third trimester, not applicable or unspecified fetus  - Fetal nonstress test  Preterm labor  symptoms and general obstetric precautions including but not limited to vaginal bleeding, contractions, leaking of fluid and fetal movement were reviewed in detail with the patient. Please refer to After Visit Summary for other counseling recommendations.  No Follow-up on file.   Rhea PinkLori A Clemmons, CNM

## 2015-10-20 ENCOUNTER — Telehealth: Payer: Self-pay | Admitting: *Deleted

## 2015-10-20 DIAGNOSIS — N760 Acute vaginitis: Principal | ICD-10-CM

## 2015-10-20 DIAGNOSIS — B9689 Other specified bacterial agents as the cause of diseases classified elsewhere: Secondary | ICD-10-CM

## 2015-10-20 MED ORDER — METRONIDAZOLE 500 MG PO TABS: 500.0000 mg | ORAL_TABLET | Freq: Two times a day (BID) | ORAL | Status: DC

## 2015-10-20 NOTE — Telephone Encounter (Signed)
Patient did not get a medicine for bacterial vaginosis and needs something sent it.  I will send in Flagyl.

## 2015-10-20 NOTE — Telephone Encounter (Signed)
-----   Message from Olevia BowensJacinda S Battle sent at 10/16/2015 11:04 AM EST ----- Regarding: Rx Request Contact: [phone] Called and states she is missing a medicine? Something for her PH? [address] [facility] [address] [place]

## 2015-10-22 ENCOUNTER — Ambulatory Visit (INDEPENDENT_AMBULATORY_CARE_PROVIDER_SITE_OTHER): Payer: Medicaid Other | Admitting: Certified Nurse Midwife

## 2015-10-22 VITALS — BP 121/79 | HR 98 | Wt 148.0 lb

## 2015-10-22 DIAGNOSIS — Z3483 Encounter for supervision of other normal pregnancy, third trimester: Secondary | ICD-10-CM

## 2015-10-22 NOTE — Patient Instructions (Signed)

## 2015-10-22 NOTE — Progress Notes (Signed)
Subjective:  Meagan Carpenter is a 20 y.o. G3P1011 at [redacted]w[redacted]d being seen today for ongoing prenatal care.  She is currently monitored for the following issues for this low-risk pregnancy and has Tension headache; Migraine headache without aura; Supervision of normal pregnancy, antepartum; and Tinea versicolor on her problem list.  Patient reports occasional contractions.  Contractions: Irregular. Vag. Bleeding: None.  Movement: Present. Denies leaking of fluid.   The following portions of the patient's history were reviewed and updated as appropriate: allergies, current medications, past family history, past medical history, past social history, past surgical history and problem list. Problem list updated.  Objective:   Filed Vitals:   10/22/15 1501  BP: 121/79  Pulse: 98  Weight: 148 lb (67.132 kg)    Fetal Status: Fetal Heart Rate (bpm): 150   Movement: Present     General:  Alert, oriented and cooperative. Patient is in no acute distress.  Skin: Skin is warm and dry. No rash noted.   Cardiovascular: Normal heart rate noted  Respiratory: Normal respiratory effort, no problems with respiration noted  Abdomen: Soft, gravid, appropriate for gestational age. Pain/Pressure: Present     Pelvic: Vag. Bleeding: None Vag D/C Character: Thin   Cervical exam performed        Extremities: Normal range of motion.  Edema: None  Mental Status: Normal mood and affect. Normal behavior. Normal judgment and thought content.   Urinalysis: Urine Protein: Negative Urine Glucose: Negative  Assessment and Plan:  Pregnancy: G3P1011 at [redacted]w[redacted]d  1. Supervision of normal pregnancy, antepartum, third trimester   Preterm labor symptoms and general obstetric precautions including but not limited to vaginal bleeding, contractions, leaking of fluid and fetal movement were reviewed in detail with the patient. Please refer to After Visit Summary for other counseling recommendations.  Return in about 1 week (around  10/29/2015).   Rhea Pink, CNM

## 2015-10-29 ENCOUNTER — Ambulatory Visit (INDEPENDENT_AMBULATORY_CARE_PROVIDER_SITE_OTHER): Payer: Medicaid Other | Admitting: Certified Nurse Midwife

## 2015-10-29 ENCOUNTER — Encounter: Payer: Self-pay | Admitting: *Deleted

## 2015-10-29 VITALS — BP 121/76 | HR 89 | Wt 150.0 lb

## 2015-10-29 DIAGNOSIS — O368130 Decreased fetal movements, third trimester, not applicable or unspecified: Secondary | ICD-10-CM | POA: Diagnosis not present

## 2015-10-29 DIAGNOSIS — Z3483 Encounter for supervision of other normal pregnancy, third trimester: Secondary | ICD-10-CM

## 2015-10-29 NOTE — Progress Notes (Signed)
Pt c/o blood tinged discharge when she wipes occasionally.

## 2015-10-29 NOTE — Patient Instructions (Signed)

## 2015-10-29 NOTE — Progress Notes (Signed)
Subjective:  Meagan Carpenter is a 20 y.o. G3P1011 at [redacted]w[redacted]d being seen today for ongoing prenatal care.  She is currently monitored for the following issues for this low-risk pregnancy and has Tension headache; Migraine headache without aura; Supervision of normal pregnancy, antepartum; and Tinea versicolor on her problem list.  Patient reports no complaints.  Contractions: Irregular. Vag. Bleeding: Scant.  Movement: (!) Decreased. Denies leaking of fluid.   The following portions of the patient's history were reviewed and updated as appropriate: allergies, current medications, past family history, past medical history, past social history, past surgical history and problem list. Problem list updated.  Objective:   Filed Vitals:   10/29/15 1323  BP: 121/76  Pulse: 89  Weight: 150 lb (68.04 kg)    Fetal Status: Fetal Heart Rate (bpm): 145 Fundal Height: 35 cm Movement: (!) Decreased     General:  Alert, oriented and cooperative. Patient is in no acute distress.  Skin: Skin is warm and dry. No rash noted.   Cardiovascular: Normal heart rate noted  Respiratory: Normal respiratory effort, no problems with respiration noted  Abdomen: Soft, gravid, appropriate for gestational age. Pain/Pressure: Present     Pelvic: Vag. Bleeding: Scant Vag D/C Character: Thin   Cervical exam performed Dilation: 2.5 Effacement (%): 50 Station: -2  Extremities: Normal range of motion.  Edema: None  Mental Status: Normal mood and affect. Normal behavior. Normal judgment and thought content.   Urinalysis: Urine Protein: Trace Urine Glucose: Negative  Assessment and Plan:  Pregnancy: G3P1011 at [redacted]w[redacted]d  1. Decreased fetal movement, third trimester, not applicable or unspecified fetus  - Fetal nonstress test reactive  2. Supervision of normal pregnancy, antepartum, third trimester   Term labor symptoms and general obstetric precautions including but not limited to vaginal bleeding, contractions, leaking of  fluid and fetal movement were reviewed in detail with the patient. Please refer to After Visit Summary for other counseling recommendations.  Return in about 1 week (around 11/05/2015).   Rhea Pink, CNM

## 2015-11-05 ENCOUNTER — Ambulatory Visit (INDEPENDENT_AMBULATORY_CARE_PROVIDER_SITE_OTHER): Payer: Medicaid Other | Admitting: Certified Nurse Midwife

## 2015-11-05 ENCOUNTER — Encounter (HOSPITAL_COMMUNITY): Payer: Self-pay

## 2015-11-05 ENCOUNTER — Inpatient Hospital Stay (HOSPITAL_COMMUNITY): Payer: Medicaid Other | Admitting: Anesthesiology

## 2015-11-05 ENCOUNTER — Inpatient Hospital Stay (HOSPITAL_COMMUNITY)
Admission: AD | Admit: 2015-11-05 | Discharge: 2015-11-07 | DRG: 775 | Disposition: A | Discharge status: Home / Self Care | Payer: Medicaid Other | Source: Ambulatory Visit | Attending: Obstetrics and Gynecology | Admitting: Obstetrics and Gynecology

## 2015-11-05 VITALS — BP 128/83 | HR 98 | Wt 150.0 lb

## 2015-11-05 DIAGNOSIS — Z8249 Family history of ischemic heart disease and other diseases of the circulatory system: Secondary | ICD-10-CM | POA: Diagnosis not present

## 2015-11-05 DIAGNOSIS — O09293 Supervision of pregnancy with other poor reproductive or obstetric history, third trimester: Secondary | ICD-10-CM

## 2015-11-05 DIAGNOSIS — Z823 Family history of stroke: Secondary | ICD-10-CM

## 2015-11-05 DIAGNOSIS — Z3483 Encounter for supervision of other normal pregnancy, third trimester: Secondary | ICD-10-CM

## 2015-11-05 DIAGNOSIS — Z3A38 38 weeks gestation of pregnancy: Secondary | ICD-10-CM | POA: Diagnosis not present

## 2015-11-05 DIAGNOSIS — Z87891 Personal history of nicotine dependence: Secondary | ICD-10-CM | POA: Diagnosis not present

## 2015-11-05 DIAGNOSIS — B360 Pityriasis versicolor: Secondary | ICD-10-CM

## 2015-11-05 DIAGNOSIS — IMO0001 Reserved for inherently not codable concepts without codable children: Secondary | ICD-10-CM

## 2015-11-05 DIAGNOSIS — R51 Headache: Secondary | ICD-10-CM | POA: Diagnosis present

## 2015-11-05 LAB — CBC
HCT: 30.2 % — ABNORMAL LOW (ref 36.0–46.0)
Hemoglobin: 9.7 g/dL — ABNORMAL LOW (ref 12.0–15.0)
MCH: 26 pg (ref 26.0–34.0)
MCHC: 32.1 g/dL (ref 30.0–36.0)
MCV: 81 fL (ref 78.0–100.0)
PLATELETS: 312 10*3/uL (ref 150–400)
RBC: 3.73 MIL/uL — ABNORMAL LOW (ref 3.87–5.11)
RDW: 14.4 % (ref 11.5–15.5)
WBC: 11 10*3/uL — AB (ref 4.0–10.5)

## 2015-11-05 LAB — PROTEIN / CREATININE RATIO, URINE
CREATININE, URINE: 155 mg/dL
PROTEIN CREATININE RATIO: 0.17 mg/mg{creat} — AB (ref 0.00–0.15)
Total Protein, Urine: 27 mg/dL

## 2015-11-05 LAB — URINALYSIS, ROUTINE W REFLEX MICROSCOPIC
BILIRUBIN URINE: NEGATIVE
Glucose, UA: NEGATIVE mg/dL
HGB URINE DIPSTICK: NEGATIVE
Ketones, ur: 15 mg/dL — AB
Nitrite: NEGATIVE
PROTEIN: NEGATIVE mg/dL
Specific Gravity, Urine: 1.02 (ref 1.005–1.030)
pH: 7 (ref 5.0–8.0)

## 2015-11-05 LAB — TYPE AND SCREEN
ABO/RH(D): O POS
ANTIBODY SCREEN: NEGATIVE

## 2015-11-05 LAB — COMPREHENSIVE METABOLIC PANEL
ALBUMIN: 3.2 g/dL — AB (ref 3.5–5.0)
ALT: 8 U/L — ABNORMAL LOW (ref 14–54)
ANION GAP: 9 (ref 5–15)
AST: 18 U/L (ref 15–41)
Alkaline Phosphatase: 250 U/L — ABNORMAL HIGH (ref 38–126)
BUN: 9 mg/dL (ref 6–20)
CHLORIDE: 104 mmol/L (ref 101–111)
CO2: 20 mmol/L — ABNORMAL LOW (ref 22–32)
Calcium: 8.4 mg/dL — ABNORMAL LOW (ref 8.9–10.3)
Creatinine, Ser: 0.48 mg/dL (ref 0.44–1.00)
GFR calc Af Amer: >60 mL/min (ref >60)
Glucose, Bld: 81 mg/dL (ref 65–99)
POTASSIUM: 3.9 mmol/L (ref 3.5–5.1)
Sodium: 133 mmol/L — ABNORMAL LOW (ref 135–145)
Total Bilirubin: 0.8 mg/dL (ref 0.3–1.2)
Total Protein: 6.8 g/dL (ref 6.5–8.1)

## 2015-11-05 LAB — URINE MICROSCOPIC-ADD ON: Bacteria, UA: NONE SEEN

## 2015-11-05 LAB — GROUP B STREP BY PCR: GROUP B STREP BY PCR: NEGATIVE

## 2015-11-05 MED ORDER — FENTANYL CITRATE (PF) 100 MCG/2ML IJ SOLN
INTRAMUSCULAR | Status: AC
  Administered 2015-11-05: 100 ug via INTRAVENOUS
  Filled 2015-11-05: qty 2

## 2015-11-05 MED ORDER — EPHEDRINE 5 MG/ML INJ
10.0000 mg | INTRAVENOUS | Status: DC | PRN
  Filled 2015-11-05: qty 2

## 2015-11-05 MED ORDER — FENTANYL CITRATE (PF) 100 MCG/2ML IJ SOLN
100.0000 ug | INTRAMUSCULAR | Status: DC | PRN
Start: 2015-11-05 — End: 2015-11-07
  Administered 2015-11-05: 100 ug via INTRAVENOUS

## 2015-11-05 MED ORDER — OXYCODONE-ACETAMINOPHEN 5-325 MG PO TABS: 2.0000 | ORAL_TABLET | ORAL | Status: DC | PRN

## 2015-11-05 MED ORDER — LIDOCAINE HCL (PF) 1 % IJ SOLN
30.0000 mL | INTRAMUSCULAR | Status: DC | PRN
  Filled 2015-11-05: qty 30

## 2015-11-05 MED ORDER — ACETAMINOPHEN 325 MG PO TABS
650.0000 mg | ORAL_TABLET | ORAL | Status: DC | PRN
  Administered 2015-11-06 (×2): 650 mg via ORAL
  Filled 2015-11-05 (×3): qty 2

## 2015-11-05 MED ORDER — ONDANSETRON HCL 4 MG/2ML IJ SOLN: 4.0000 mg | Freq: Four times a day (QID) | INTRAMUSCULAR | Status: DC | PRN

## 2015-11-05 MED ORDER — PHENYLEPHRINE 40 MCG/ML (10ML) SYRINGE FOR IV PUSH (FOR BLOOD PRESSURE SUPPORT)
80.0000 ug | PREFILLED_SYRINGE | INTRAVENOUS | Status: DC | PRN
  Filled 2015-11-05: qty 2
  Filled 2015-11-05: qty 20

## 2015-11-05 MED ORDER — OXYTOCIN BOLUS FROM INFUSION: 500.0000 mL | INTRAVENOUS | Status: DC

## 2015-11-05 MED ORDER — FENTANYL 2.5 MCG/ML BUPIVACAINE 1/10 % EPIDURAL INFUSION (WH - ANES)
14.0000 mL/h | INTRAMUSCULAR | Status: DC | PRN
  Administered 2015-11-05: 14 mL/h via EPIDURAL
  Filled 2015-11-05: qty 125

## 2015-11-05 MED ORDER — LACTATED RINGERS IV SOLN: INTRAVENOUS | Status: DC

## 2015-11-05 MED ORDER — CITRIC ACID-SODIUM CITRATE 334-500 MG/5ML PO SOLN: 30.0000 mL | ORAL | Status: DC | PRN

## 2015-11-05 MED ORDER — LIDOCAINE HCL (PF) 1 % IJ SOLN
INTRAMUSCULAR | Status: DC | PRN
  Administered 2015-11-05: 9 mL via EPIDURAL
  Administered 2015-11-05: 6 mL via EPIDURAL

## 2015-11-05 MED ORDER — LACTATED RINGERS IV SOLN
500.0000 mL | INTRAVENOUS | Status: DC | PRN
  Administered 2015-11-05: 500 mL via INTRAVENOUS

## 2015-11-05 MED ORDER — OXYCODONE-ACETAMINOPHEN 5-325 MG PO TABS: 1.0000 | ORAL_TABLET | ORAL | Status: DC | PRN

## 2015-11-05 MED ORDER — OXYTOCIN 10 UNIT/ML IJ SOLN
2.5000 [IU]/h | INTRAVENOUS | Status: DC
  Filled 2015-11-05: qty 4

## 2015-11-05 MED ORDER — IBUPROFEN 600 MG PO TABS
600.0000 mg | ORAL_TABLET | Freq: Four times a day (QID) | ORAL | Status: DC
  Administered 2015-11-05 – 2015-11-07 (×6): 600 mg via ORAL
  Filled 2015-11-05 (×6): qty 1

## 2015-11-05 MED ORDER — DIPHENHYDRAMINE HCL 50 MG/ML IJ SOLN: 12.5000 mg | INTRAMUSCULAR | Status: DC | PRN

## 2015-11-05 NOTE — MAU Note (Signed)
Pt sent from [facility] for [facility] evaluation. Pt denies any vaginal bleeding or abnormal discharge. Reports headache.

## 2015-11-05 NOTE — Progress Notes (Signed)
Subjective:  Meagan Carpenter is a 20 y.o. G3P1011 at [redacted]w[redacted]d being seen today for ongoing prenatal care.  She is currently monitored for the following issues for this low-risk pregnancy and has Tension headache; Migraine headache without aura; Supervision of normal pregnancy, antepartum; and Tinea versicolor on her problem list.  Patient reports headache, visual disturbances..  Contractions: Irregular. Vag. Bleeding: None.  Movement: Present. Denies leaking of fluid.   The following portions of the patient's history were reviewed and updated as appropriate: allergies, current medications, past family history, past medical history, past social history, past surgical history and problem list. Problem list updated.  Objective:   Filed Vitals:   11/05/15 1326  BP: 128/83  Pulse: 98  Weight: 150 lb (68.04 kg)    Fetal Status: Fetal Heart Rate (bpm): 147   Movement: Present     General:  Alert, oriented and cooperative. Patient is in no acute distress.  Skin: Skin is warm and dry. No rash noted.   Cardiovascular: Normal heart rate noted  Respiratory: Normal respiratory effort, no problems with respiration noted  Abdomen: Soft, gravid, appropriate for gestational age. Pain/Pressure: Present     Pelvic: Vag. Bleeding: None Vag D/C Character: Thin   Cervical exam performed        Extremities: Normal range of motion.  Edema: None  Mental Status: Normal mood and affect. Normal behavior. Normal judgment and thought content.   Urinalysis: Urine Protein: Negative Urine Glucose: Negative  Assessment and Plan:  Pregnancy: G3P1011 at [redacted]w[redacted]d  There are no diagnoses linked to this encounter. Term labor symptoms and general obstetric precautions including but not limited to vaginal bleeding, contractions, leaking of fluid and fetal movement were reviewed in detail with the patient. Please refer to After Visit Summary for other counseling recommendations.  No Follow-up on file.  Sent to MAU for  preeclampsia labs  Rhea Pink, CNM

## 2015-11-05 NOTE — Progress Notes (Signed)
RN asked patient gender of baby.  Patient replied, "Girl.  She's the devil".  RN asked patient what she meant, patient made no reply.  Wolfgang Phoenix RN

## 2015-11-05 NOTE — Patient Instructions (Signed)

## 2015-11-05 NOTE — H&P (Signed)
Meagan Carpenter is a 20 y.o. female presenting for evaluation of hypertension and headache from [facility] office today. Her evaluation for that has been negative. None of her pressures were elevated.  However, she has been laboring and changing her cervix.   Maternal Medical History:  Reason for admission: Contractions.  Nausea.  Contractions: Onset was 1-2 hours ago.   Frequency: regular.   Perceived severity is moderate.    Fetal activity: Perceived fetal activity is normal.   Last perceived fetal movement was within the past hour.    Prenatal complications: No bleeding, HIV, PIH, placental abnormality, pre-eclampsia, preterm labor or substance abuse.   Prenatal Complications - Diabetes: none.    OB History    Gravida Para Term Preterm AB TAB SAB Ectopic Multiple Living   0 1 0 1 0 0 1     Past Medical History  Diagnosis Date  . Headache(784.0)   . UTI (lower urinary tract infection)   . Chlamydia    Past Surgical History  Procedure Laterality Date  . Elbow fracture surgery  2012   Family History: family history includes ADD / ADHD in her brother and father; Autism in her other; Cancer (age of onset: 63) in her paternal grandmother; Heart failure in her paternal grandmother; Hyperlipidemia in her paternal grandmother; Hypertension in her paternal grandmother; Migraines in her paternal grandmother; Stroke in her paternal grandfather. Social History:  reports that she quit smoking about 5 years ago. Her smoking use included Cigarettes. She has never used smokeless tobacco. She reports that she does not drink alcohol or use illicit drugs.   Prenatal Transfer Tool  Maternal Diabetes: No Genetic Screening: Normal Maternal Ultrasounds/Referrals: Normal Fetal Ultrasounds or other Referrals:  None Maternal Substance Abuse:  No Significant Maternal Medications:  None Significant Maternal Lab Results:  None Other Comments:  None  Review of Systems  Constitutional:  Negative for fever, chills and malaise/fatigue.  Eyes: Positive for blurred vision. Negative for double vision.  Respiratory: Negative for shortness of breath.   Cardiovascular: Negative for leg swelling.  Gastrointestinal: Positive for abdominal pain. Negative for nausea, vomiting, diarrhea and constipation.  Genitourinary: Negative for dysuria.  Musculoskeletal: Negative for back pain.  Neurological: Positive for headaches. Negative for dizziness and focal weakness.    Dilation: 5 Effacement (%): 100 Exam by:: Ginger Morris RN Blood pressure 117/71, pulse 77, temperature 98.2 F (36.8 C), resp. rate 18, not currently breastfeeding. Maternal Exam:  Uterine Assessment: Contraction strength is moderate.  Contraction frequency is regular.   Abdomen: Patient reports no abdominal tenderness. Fundal height is 37.   Estimated fetal weight is 7.   Fetal presentation: vertex  Introitus: Normal vulva. Normal vagina.  Vagina is negative for discharge.  Ferning test: not done.  Nitrazine test: not done. Amniotic fluid character: not assessed.  Pelvis: adequate for delivery.   Cervix: Cervix evaluated by digital exam.     Fetal Exam Fetal Monitor Review: Mode: ultrasound.   Baseline rate: 140.  Variability: moderate (6-25 bpm).   Pattern: accelerations present.    Fetal State Assessment: Category I - tracings are normal.     Filed Vitals:   11/05/15 1431 11/05/15 1447 11/05/15 1501 11/05/15 1516  BP: 115/70 115/70 103/51 117/71  Pulse: 98 91 91 77  Temp:      Resp:        Physical Exam  Constitutional: She is oriented to person, place, and time. She appears well-developed and well-nourished. No distress.  HENT:  Head: Normocephalic.  Cardiovascular: Normal rate, regular rhythm and normal heart sounds.  Exam reveals no gallop and no friction rub.   No murmur heard. Respiratory: Effort normal. No respiratory distress. She has no wheezes. She has no rales.  GI: Soft. She  exhibits no distension and no mass. There is no tenderness. There is no rebound and no guarding.  Genitourinary: Vagina normal. No vaginal discharge found.  Musculoskeletal: Normal range of motion.  Neurological: She is alert and oriented to person, place, and time. She has normal reflexes. She displays normal reflexes. She exhibits normal muscle tone.  Skin: Skin is warm and dry.  Psychiatric: She has a normal mood and affect.    Prenatal labs: ABO, Rh: O/POS/-- (07/05 1543) Antibody: NEG (07/05 1543) Rubella: 1.85 (07/05 1543) RPR: NON REAC (12/14 1322)  HBsAg: NEGATIVE (07/05 1543)  HIV: NONREACTIVE (12/14 1322)  GBS:     Assessment/Plan: [facility] at [redacted]w[redacted]d  Normotensive Active Labor  Admit to [facility] suites Routine orders Anticipate SVD   [facility] 11/05/2015, 6:07 PM

## 2015-11-05 NOTE — Anesthesia Procedure Notes (Signed)
Epidural Patient location during procedure: OB Start time: 11/05/2015 5:18 PM End time: 11/05/2015 5:22 PM  Staffing Anesthesiologist: Leilani Able Performed by: anesthesiologist   Preanesthetic Checklist Completed: patient identified, surgical consent, pre-op evaluation, timeout performed, IV checked, risks and benefits discussed and monitors and equipment checked  Epidural Patient position: sitting Prep: site prepped and draped and DuraPrep Patient monitoring: continuous pulse ox and blood pressure Approach: midline Location: L3-L4 Injection technique: LOR air  Needle:  Needle type: Tuohy  Needle gauge: 17 G Needle length: 9 cm and 9 Needle insertion depth: 5 cm cm Catheter type: closed end flexible Catheter size: 19 Gauge Catheter at skin depth: 10 cm Test dose: negative and Other  Assessment Sensory level: T9 Events: blood not aspirated, injection not painful, no injection resistance, negative IV test and no paresthesia  Additional Notes Reason for block:procedure for pain

## 2015-11-05 NOTE — Anesthesia Preprocedure Evaluation (Signed)
Anesthesia Evaluation  Patient identified by MRN, date of birth, ID band Patient awake    Reviewed: Allergy & Precautions, H&P , NPO status , Patient's Chart, lab work & pertinent test results  Airway Mallampati: I  TM Distance: >3 FB Neck ROM: full    Dental no notable dental hx.    Pulmonary former smoker,    Pulmonary exam normal        Cardiovascular negative cardio ROS Normal cardiovascular exam     Neuro/Psych negative psych ROS   GI/Hepatic negative GI ROS, Neg liver ROS,   Endo/Other  negative endocrine ROS  Renal/GU negative Renal ROS     Musculoskeletal   Abdominal Normal abdominal exam  (+)   Peds  Hematology negative hematology ROS (+)   Anesthesia Other Findings   Reproductive/Obstetrics (+) Pregnancy                             Anesthesia Physical Anesthesia Plan  ASA: II  Anesthesia Plan: Epidural   Post-op Pain Management:    Induction:   Airway Management Planned:   Additional Equipment:   Intra-op Plan:   Post-operative Plan:   Informed Consent: I have reviewed the patients History and Physical, chart, labs and discussed the procedure including the risks, benefits and alternatives for the proposed anesthesia with the patient or authorized representative who has indicated his/her understanding and acceptance.     Plan Discussed with:   Anesthesia Plan Comments:         Anesthesia Quick Evaluation  

## 2015-11-06 LAB — RPR: RPR: NONREACTIVE

## 2015-11-06 MED ORDER — LANOLIN HYDROUS EX OINT
TOPICAL_OINTMENT | CUTANEOUS | Status: DC | PRN
Start: 2015-11-06 — End: 2015-11-07

## 2015-11-06 MED ORDER — ZOLPIDEM TARTRATE 5 MG PO TABS: 5.0000 mg | ORAL_TABLET | Freq: Every evening | ORAL | Status: DC | PRN

## 2015-11-06 MED ORDER — TETANUS-DIPHTH-ACELL PERTUSSIS 5-2.5-18.5 LF-MCG/0.5 IM SUSP: 0.5000 mL | Freq: Once | INTRAMUSCULAR | Status: DC

## 2015-11-06 MED ORDER — WITCH HAZEL-GLYCERIN EX PADS: 1.0000 "application " | MEDICATED_PAD | CUTANEOUS | Status: DC | PRN

## 2015-11-06 MED ORDER — DIBUCAINE 1 % RE OINT: 1.0000 "application " | TOPICAL_OINTMENT | RECTAL | Status: DC | PRN

## 2015-11-06 MED ORDER — DIPHENHYDRAMINE HCL 25 MG PO CAPS: 25.0000 mg | ORAL_CAPSULE | Freq: Four times a day (QID) | ORAL | Status: DC | PRN

## 2015-11-06 MED ORDER — PRENATAL MULTIVITAMIN CH
1.0000 | ORAL_TABLET | Freq: Every day | ORAL | Status: DC
  Administered 2015-11-06: 1 via ORAL
  Filled 2015-11-06: qty 1

## 2015-11-06 MED ORDER — ACETAMINOPHEN 325 MG PO TABS
650.0000 mg | ORAL_TABLET | ORAL | Status: DC | PRN
  Administered 2015-11-07: 650 mg via ORAL

## 2015-11-06 MED ORDER — BENZOCAINE-MENTHOL 20-0.5 % EX AERO
1.0000 "application " | INHALATION_SPRAY | CUTANEOUS | Status: DC | PRN
  Filled 2015-11-06: qty 56

## 2015-11-06 MED ORDER — ONDANSETRON HCL 4 MG/2ML IJ SOLN: 4.0000 mg | INTRAMUSCULAR | Status: DC | PRN

## 2015-11-06 MED ORDER — ONDANSETRON HCL 4 MG PO TABS: 4.0000 mg | ORAL_TABLET | ORAL | Status: DC | PRN

## 2015-11-06 MED ORDER — SENNOSIDES-DOCUSATE SODIUM 8.6-50 MG PO TABS
2.0000 | ORAL_TABLET | ORAL | Status: DC
  Administered 2015-11-07: 2 via ORAL
  Filled 2015-11-06: qty 2

## 2015-11-06 MED ORDER — SIMETHICONE 80 MG PO CHEW: 80.0000 mg | CHEWABLE_TABLET | ORAL | Status: DC | PRN

## 2015-11-06 NOTE — Discharge Summary (Signed)
OB Discharge Summary     Patient Name: Meagan Carpenter DOB: April 01, 1996 MRN: [id number]  Date of admission: 11/05/2015 Delivering MD: Wynne Dust   Date of discharge: 11/07/2015  Admitting diagnosis: 38WKS,BLOOD WORK Intrauterine pregnancy: [redacted]w[redacted]d     Secondary diagnosis:  Active Problems:   Active labor at term  Additional problems: none     Discharge diagnosis: Term Pregnancy Delivered                                                                                                Post partum procedures:none  Augmentation: AROM  Complications: None  Hospital course:  Onset of Labor With Vaginal Delivery     20 y.o. yo [id number] at [redacted]w[redacted]d was admitted in Active Labor on 11/05/2015. Patient had an uncomplicated labor course as follows:  Membrane Rupture Time/Date: 7:39 PM ,11/05/2015   Intrapartum Procedures: Episiotomy: None [1]                                         Lacerations:  None [1]  Patient had a delivery of a Viable infant. 11/05/2015  Information for the patient's newborn:  Meagan Carpenter [[id number]]  Delivery Method: Vaginal, Spontaneous Delivery (Filed from Delivery Summary)     Pateint had an uncomplicated postpartum course.  She is ambulating, tolerating a regular diet, passing flatus, and urinating well. Patient is discharged home in stable condition on 11/07/2015.    Physical exam  Filed Vitals:   11/06/15 0500 11/06/15 0925 11/06/15 1744 11/07/15 0703  BP: 113/47 114/58 124/59 113/68  Pulse: 81 68 85 68  Temp: 98.4 F (36.9 C) 98 F (36.7 C) 97.9 F (36.6 C) 98.1 F (36.7 C)  TempSrc: Oral Oral Oral Oral  Resp: Height:      Weight:      SpO2: 99% 100%  99%   General: alert, cooperative and no distress Lochia: appropriate Uterine Fundus: firm Incision: N/A DVT Evaluation: No evidence of DVT seen on physical exam. Labs: Lab Results  Component Value Date   WBC 11.0* 11/05/2015   HGB 9.7* 11/05/2015   HCT 30.2* 11/05/2015   MCV 81.0 11/05/2015   PLT 312 11/05/2015   CMP Latest Ref Rng 11/05/2015  Glucose 65 - 99 mg/dL 81  BUN 6 - 20 mg/dL 9  Creatinine 1.30 - 8.65 mg/dL 7.84  Sodium 696 - 295 mmol/L 133(L)  Potassium 3.5 - 5.1 mmol/L 3.9  Chloride 101 - 111 mmol/L 104  CO2 22 - 32 mmol/L 20(L)  Calcium 8.9 - 10.3 mg/dL 2.8(U)  Total Protein 6.5 - 8.1 g/dL 6.8  Total Bilirubin 0.3 - 1.2 mg/dL 0.8  Alkaline Phos 38 - 126 U/L 250(H)  AST 15 - 41 U/L 18  ALT 14 - 54 U/L 8(L)    Discharge instruction: per After Visit Summary and "Baby and Me Booklet".  After visit meds:    Medication List    STOP taking these medications  acetaminophen 325 MG tablet  Commonly known as:  TYLENOL      TAKE these medications        CONCEPT DHA 53.5-38-1 MG Caps  Take one by mouth everyday.     ibuprofen 600 MG tablet  Commonly known as:  ADVIL,MOTRIN  Take 1 tablet (600 mg total) by mouth every 6 (six) hours.        Diet: routine diet  Activity: Advance as tolerated. Pelvic rest for 6 weeks.   Outpatient follow up:6 weeks Follow up Appt: Future Appointments Date Time Provider Department Center  11/12/2015 1:15 PM Rhea Pink, CNM [facility]   Follow up Visit:No Follow-up on file.  Postpartum contraception: Condoms  Newborn Data: Live born female  Birth Weight: 6 lb 8.8 oz (2970 g) APGAR: 9, 9  Baby Feeding: Bottle Disposition:home with mother   11/07/2015 Greig Right, CNM

## 2015-11-06 NOTE — Progress Notes (Cosign Needed)
POSTPARTUM PROGRESS NOTE  Post Partum Day 1 Subjective:  Meagan Carpenter is a 20 y.o. [id number] [redacted]w[redacted]d s/p SVD.  No acute events overnight.  Pt denies problems with ambulating, voiding or po intake.  She denies nausea or vomiting.  Pain is moderately controlled with intermittent cramping.  She has had flatus. She has not had bowel movement. Lochia Small. Denies light-headedness or shortness of breath while ambulation. She feels ready to go home and would like to be discharged sooner rather than later.  Objective: Blood pressure 113/47, pulse 81, temperature 98.4 F (36.9 C), temperature source Oral, resp. rate 18, height  (1.626 m), weight 68.04 kg (150 lb), SpO2 99 %, unknown if currently breastfeeding.  Physical Exam:  General: alert, cooperative and no distress Chest: CTAB Heart: RRR, normal S1S2, possible 2/6 systolic murmur best heard at RUSB Abdomen: soft, nontender Uterine Fundus: unable to palpate DVT Evaluation: No calf swelling or tenderness Extremities: mild non-pitting, pedal edema, 2+ dorsalis pedis   Recent Labs  11/05/15 1441  HGB 9.7*  HCT 30.2*    Assessment/Plan:  ASSESSMENT: Meagan Carpenter is a 20 y.o. [name] [redacted]w[redacted]d s/p SVD with first degree laceration with no repair.  Plan for discharge tomorrow Breastfeeding Lactation consult prn  Contraception - TBD - constant bleeding on depo, forgot to take OCPs, had aunt that needed IUD surgically removed, mom had problems with Nexplanon, inconsistent condom use in past   LOS: 1 day   Ann Maki 11/06/2015, 6:52 AM

## 2015-11-06 NOTE — Lactation Note (Signed)
This note was copied from the chart of Meagan Carpenter. Lactation Consultation Note  Patient Name: Meagan Carpenter OZHYQ'M Date: 11/06/2015 Reason for consult: Follow-up assessment (mom requesting formula )  Per mom the baby still seems hungry even after I fed at 1253 for 20 -30 mins.  Mom reports swallows.  LC discussed options of hand expressing, and finger feeding, or latching with a NS and instilled EBM in the  Top of the NS and of formula ( per moms request) or finger feed with syringe or SNS. Mom declined.  Mom declined option , and requested a formula with bottle nipple for supplementing if needed at bedside.  Per mom I want to give my nipples a break due to being tender.  LC assessed , no breakdown noted, no redness. Comfort gels given with instructions and mom already  Has hand pump and shells sitting at bedside. Per mom aware of how to use them.     Maternal Data    Feeding Feeding Type:  (per mom last fed at 1253 ) Length of feed: 20 min (per mom )  LATCH Score/Interventions                      Lactation Tools Discussed/Used Nipple shield size:  (per mom did not use NS , baby doesn't like it )   Consult Status Consult Status: Follow-up Date: 11/07/15 Follow-up type: In-patient    Kathrin Greathouse 11/06/2015, 2:01 PM

## 2015-11-06 NOTE — Lactation Note (Signed)
This note was copied from the chart of Meagan Deema Juncaj. Lactation Consultation Note Experienced mom has 5 months old. Had to use a NS with her first child d/t flat nipples and unable to substain a latch or get transfer. Baby can latch to Lt. Breast but unable to latch to Rt. Breast. Lt. Breast is softer than Rt. Breast. Hand expressed 2ml transitional milk. Mom has been pumping for 2 weeks and has frozen colostrum in freezer. Gave shells to evert nipple to wear in bra. Gave hand pump to evert nipple and define boarders before application of NS. Fitted for #16 NS. Reviewed NS application. Reviewed engorgement management and prevention. Mom encouraged to feed baby 8-12 times/24 hours and with feeding cues. Referred to Baby and Me Book in Breastfeeding section Pg. 22-23 for position options and Proper latch demonstration.  Educated about newborn behavior, I&O, cluster feeding, supply and demand. Mom encouraged to do skin-to-skin.[facility]/LC brochure given w/resources, support groups and LC services. Patient Name: Meagan Carpenter OZHYQ'M Date: 11/06/2015 Reason for consult: Initial assessment   Maternal Data    Feeding Feeding Type: Breast Milk  LATCH Score/Interventions       Type of Nipple: Flat Intervention(s): Shells;Hand pump  Comfort (Breast/Nipple): Soft / non-tender           Lactation Tools Discussed/Used Tools: Shells;Nipple Meagan Carpenter;Pump Nipple shield size: 16 Shell Type: Inverted Breast pump type: Manual WIC Program: Yes Pump Review: Setup, frequency, and cleaning;Milk Storage Initiated by:: Peri Jefferson RN Date initiated:: 11/06/15   Consult Status Consult Status: Follow-up Date: 11/06/15 Follow-up type: In-patient    Charyl Dancer 11/06/2015, 5:26 AM

## 2015-11-06 NOTE — Anesthesia Postprocedure Evaluation (Signed)
Anesthesia Post Note  Patient: Meagan Carpenter  Procedure(s) Performed: * No procedures listed *  Patient location during evaluation: Mother Baby Anesthesia Type: Epidural Level of consciousness: awake and alert Pain management: pain level controlled Vital Signs Assessment: post-procedure vital signs reviewed and stable Respiratory status: spontaneous breathing and nonlabored ventilation Cardiovascular status: stable Postop Assessment: no headache, no backache, no signs of nausea or vomiting and adequate PO intake Anesthetic complications: no    Last Vitals:  Filed Vitals:   11/06/15 0055 11/06/15 0500  BP: 122/52 113/47  Pulse: 92 81  Temp: 37.2 C 36.9 C  Resp: 18 18    Last Pain:  Filed Vitals:   11/06/15 0543  PainSc: 3                  Ily Denno

## 2015-11-06 NOTE — Progress Notes (Signed)
UR chart review completed.  

## 2015-11-07 MED ORDER — SERTRALINE HCL 50 MG PO TABS: ORAL_TABLET | ORAL | Status: DC

## 2015-11-07 MED ORDER — IBUPROFEN 600 MG PO TABS: 600.0000 mg | ORAL_TABLET | Freq: Four times a day (QID) | ORAL | Status: DC

## 2015-11-07 NOTE — Clinical Social Work Maternal (Signed)
CLINICAL SOCIAL WORK MATERNAL/CHILD NOTE  Patient Details  Name: Meagan Carpenter: [id number] Date of Birth: [birth year]  Date:  11/07/2015  Clinical Social Worker Initiating Note:  Loleta Books MSW, LCSW Date/ Time Initiated:  11/07/15/0915    Child's Name:  Meagan Carpenter   Legal Guardian:  Meagan Carpenter and Meagan Carpenter  Need for Interpreter:  None   Date of Referral:  11/07/15     Reason for Referral:  History of PPD  Referral Source:  Meagan Carpenter   Address:  [address] [place], [place] [address]  Phone number:  [phone]   Household Members:  Significant Other, Minor Children   Natural Supports (not living in the home):  Immediate Family   Professional Supports: None   Employment: Part-time   Type of Work: Doctor, 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 Mental Health Concerns-- [name] presents with history of postpartum depression.  She endorsed symptoms of perinatal mood disorders during this pregnancy, and endorsed emotional lability since this infant has been born.  [name] expressed interest in creating a care plan to support her mental health prior to discharge.   Cognitive State:  Able to Concentrate , Alert , Goal Oriented , Linear Thinking    Mood/Affect:  Bright , Happy    CSW Assessment:  CSW received request for consult due to [name] presenting with a history of postpartum depression. Prior to meeting with [name], CSW reviewed postpartum visit in January 2016. [name] reported feeling overwhelmed and on edge due to infant constantly crying, being a poor feeder, and always screaming.  [name] was prescribed Zoloft.  [name] presented as easily engaged and receptive to the visit. [name] readily reflected upon her  previous postpartum period, the symptoms she experienced, and her perceptions of the factors that led to her symptoms. [name] reported that she experienced a "tramatic birth", was a first time mother, unresolved feelings from her childhood relationships with her parents, and feeling overwhelmed.  [name] stated that she constantly felt overwhelmed, would cry frequently, and was highly irritable.  [name] discussed that she only took the prescribed medication for 2 weeks, and discontinued due to her perceptions of it making her feel badly.    [name] was receptive to exploring strategies that assisted her to cope with her previous postpartum period, strategies to build upon her established strengths, her self-identified areas of need, and potential changes to help support her as she transitions postpartum.  [name] stated that she a strong support system, and reported that she is grateful for her grandparents. She stated that they help her frequently with her other son, and reported that they will be able to help again in order to ensure that she has sufficient sleep.   [name] expressed belief that a primary stressor with the previous postpartum was balancing work and parenting. She stated that she is going to be working less, and will then enroll in a program to complete her high school diploma. She reflected upon how she believes that this will be helpful to reduce stress, but also recognizes that she can sometimes feel overwhelmed when she does not get out of the home or interact with other adults.  [name] expressed interest in reaching out  to her friends more and to participate in other informal support groups.  [name] stated that she frequently does not respond to requests to meet up, but recognized the potential benefits of reaching out to others.    [name] progressed during the assessment in regards to her readiness to initiate mental health treatment. She originally was resistant and shared that she did not believe it was necessary;  however, CSW was able to guide the [name] by developing discrepency.  [name] reflected upon the past 18 months, and shared that she was realizing how her untreated mental health had a negative impact on her relationship with the FOB, and she was not always being the mother she wants to be.  [name] reflected upon a future with treated symptoms, and she she shared belief that it was a future she wanted since she could feel happier and more fulfilled.  [name] expressed appreciation for referral information to therapists.   [name] presented as receptive to exploring potential medications with her OB provider. She expressed hesitancy due to her father having a history of addiction to pills; however, she admitted that she was not sure if antidepressants are addictive.  CSW provided education on antidepressants, and [name] expressed interest in restarting medication prior to discharge. She provided consent for CSW to consult with her OB.  [name] was unable to identify additional areas of support or unmet needs.  She agreed to closely monitor her mental health and to follow up with her providers if she notes increase in symptoms prior to her postpartum visit.  During the assessment, [name] stated that she is motivated each day by her children. She shared that she works every day to be a good mother, and is goal orientated to continue to improve herself. [name] reflected upon her relationships with her parents, and stated that she wants to provide her with a childhood that she never had.  [name] stated that even on days that she feels like giving up, she can look at her children and know that she will be "okay".  [name] expressed appreciation for the visit, and agreed to contact CSW prior to discharge if needs arise.   CSW Plan/Description:   1. Patient/Family Education-- Perinatal mood disorders 2. CSW consulted with Dr. Ashok Pall, and recommended medication evaluation prior to discharge and a 2 week postpartum follow up.  Dr. Ashok Pall to address.  3.  Information/Referral to [facility]: Outpatient therapy resources and informal support groups. 4. No Further Intervention Required/No Barriers to Discharge    Pervis Hocking, LCSW 11/07/2015, 12:04 PM

## 2015-11-07 NOTE — Discharge Instructions (Signed)
Postpartum Care After Vaginal Delivery °After you deliver your newborn (postpartum period), the usual stay in the hospital is 24-72 hours. If there were problems with your labor or delivery, or if you have other medical problems, you might be in the hospital longer.  °While you are in the hospital, you will receive help and instructions on how to care for yourself and your newborn during the postpartum period.  °While you are in the hospital: °· Be sure to tell your nurses if you have pain or discomfort, as well as where you feel the pain and what makes the pain worse. °· If you had an incision made near your vagina (episiotomy) or if you had some tearing during delivery, the nurses may put ice packs on your episiotomy or tear. The ice packs may help to reduce the pain and swelling. °· If you are breastfeeding, you may feel uncomfortable contractions of your uterus for a couple of weeks. This is normal. The contractions help your uterus get back to normal size. °· It is normal to have some bleeding after delivery. °· For the first 1-3 days after delivery, the flow is red and the amount may be similar to a period. °· It is common for the flow to start and stop. °· In the first few days, you may pass some small clots. Let your nurses know if you begin to pass large clots or your flow increases. °· Do not  flush blood clots down the toilet before having the nurse look at them. °· During the next 3-10 days after delivery, your flow should become more watery and pink or brown-tinged in color. °· Ten to fourteen days after delivery, your flow should be a small amount of yellowish-white discharge. °· The amount of your flow will decrease over the first few weeks after delivery. Your flow may stop in 6-8 weeks. Most women have had their flow stop by 12 weeks after delivery. °· You should change your sanitary pads frequently. °· Wash your hands thoroughly with soap and water for at least 20 seconds after changing pads, using  the toilet, or before holding or feeding your newborn. °· You should feel like you need to empty your bladder within the first 6-8 hours after delivery. °· In case you become weak, lightheaded, or faint, call your nurse before you get out of bed for the first time and before you take a shower for the first time. °· Within the first few days after delivery, your breasts may begin to feel tender and full. This is called engorgement. Breast tenderness usually goes away within 48-72 hours after engorgement occurs. You may also notice milk leaking from your breasts. If you are not breastfeeding, do not stimulate your breasts. Breast stimulation can make your breasts produce more milk. °· Spending as much time as possible with your newborn is very important. During this time, you and your newborn can feel close and get to know each other. Having your newborn stay in your room (rooming in) will help to strengthen the bond with your newborn.  It will give you time to get to know your newborn and become comfortable caring for your newborn. °· Your hormones change after delivery. Sometimes the hormone changes can temporarily cause you to feel sad or tearful. These feelings should not last more than a few days. If these feelings last longer than that, you should talk to your caregiver. °· If desired, talk to your caregiver about methods of family planning or contraception. °·   Talk to your caregiver about immunizations. Your caregiver may want you to have the following immunizations before leaving the hospital:  Tetanus, diphtheria, and pertussis (Tdap) or tetanus and diphtheria (Td) immunization. It is very important that you and your family (including grandparents) or others caring for your newborn are up-to-date with the Tdap or Td immunizations. The Tdap or Td immunization can help protect your newborn from getting ill.  Rubella immunization.  Varicella (chickenpox) immunization.  Influenza immunization. You should  receive this annual immunization if you did not receive the immunization during your pregnancy.   This information is not intended to replace advice given to you by your health care provider. Make sure you discuss any questions you have with your health care provider.   Document Released: 07/18/2007 Document Revised: 06/14/2012 Document Reviewed: 05/17/2012 Elsevier Interactive Patient Education 2016 [employer].   Iron-Rich Diet  Iron is a mineral that helps your body to produce hemoglobin. Hemoglobin is a protein in your red blood cells that carries oxygen to your body's tissues. Eating too little iron may cause you to feel weak and tired, and it can increase your risk for infection. Eating enough iron is necessary for your body's metabolism, muscle function, and nervous system. Iron is naturally found in many foods. It can also be added to foods or fortified in foods. There are two types of dietary iron:  Heme iron. Heme iron is absorbed by the body more easily than nonheme iron. Heme iron is found in meat, poultry, and fish.  Nonheme iron. Nonheme iron is found in dietary supplements, iron-fortified grains, beans, and vegetables. You may need to follow an iron-rich diet if:  You have been diagnosed with iron deficiency or iron-deficiency anemia.  You have a condition that prevents you from absorbing dietary iron, such as:  Infection in your intestines.  Celiac disease. This involves long-lasting (chronic) inflammation of your intestines.  You do not eat enough iron.  You eat a diet that is high in foods that impair iron absorption.  You have lost a lot of blood.  You have heavy bleeding during your menstrual cycle.  You are pregnant. WHAT IS MY PLAN? Your health care provider may help you to determine how much iron you need per day based on your condition. Generally, when a person consumes sufficient amounts of iron in the diet, the following iron needs are  met:  Men.  32-80 years old: 11 mg per day.  3-43 years old: 8 mg per day.  Women.   23-88 years old: 15 mg per day.  59-38 years old: 18 mg per day.  Over 110 years old: 8 mg per day.  Pregnant women: 27 mg per day.  Breastfeeding women: 9 mg per day. WHAT DO I NEED TO KNOW ABOUT AN IRON-RICH DIET?  Eat fresh fruits and vegetables that are high in vitamin C along with foods that are high in iron. This will help increase the amount of iron that your body absorbs from food, especially with foods containing nonheme iron. Foods that are high in vitamin C include oranges, peppers, tomatoes, and mango.  Take iron supplements only as directed by your health care provider. Overdose of iron can be life-threatening. If you were prescribed iron supplements, take them with orange juice or a vitamin C supplement.  Cook foods in pots and pans that are made from iron.   Eat nonheme iron-containing foods alongside foods that are high in heme iron. This helps to improve your iron  absorption.   Certain foods and drinks contain compounds that impair iron absorption. Avoid eating these foods in the same meal as iron-rich foods or with iron supplements. These include:  Coffee, black tea, and red wine.  Milk, dairy products, and foods that are high in calcium.  Beans, soybeans, and peas.  Whole grains.  When eating foods that contain both nonheme iron and compounds that impair iron absorption, follow these tips to absorb iron better.   Soak beans overnight before cooking.  Soak whole grains overnight and drain them before using.  Ferment flours before baking, such as using yeast in bread dough. WHAT FOODS CAN I EAT? Grains Iron-fortified breakfast cereal. Iron-fortified whole-wheat bread. Enriched rice. Sprouted grains. Vegetables Spinach. Potatoes with skin. Green peas. Broccoli. Red and green bell peppers. Fermented vegetables. Fruits Prunes. Raisins. Oranges. Strawberries.  Mango. Grapefruit. Meats and Other Protein Sources Beef liver. Oysters. Beef. Shrimp. [place]. Chicken. Partridge. Sardines. Chickpeas. Nuts. Tofu. Beverages Tomato juice. Fresh orange juice. Prune juice. Hibiscus tea. Fortified instant breakfast shakes. Condiments Tahini. Fermented soy sauce. Sweets and Desserts Black-strap molasses.  Other Wheat germ. The items listed above may not be a complete list of recommended foods or beverages. Contact your dietitian for more options. WHAT FOODS ARE NOT RECOMMENDED? Grains Whole grains. Bran cereal. Bran flour. Oats. Vegetables Artichokes. Brussels sprouts. Kale. Fruits Blueberries. Raspberries. Strawberries. Figs. Meats and Other Protein Sources Soybeans. Products made from soy protein. Dairy Milk. Cream. Cheese. Yogurt. Cottage cheese. Beverages Coffee. Black tea. Red wine. Sweets and Desserts Cocoa. Chocolate. Ice cream. Other Basil. Oregano. Parsley. The items listed above may not be a complete list of foods and beverages to avoid. Contact your dietitian for more information.   This information is not intended to replace advice given to you by your health care provider. Make sure you discuss any questions you have with your health care provider.   Document Released: 05/04/2005 Document Revised: 10/11/2014 Document Reviewed: 04/17/2014 Elsevier Interactive Patient Education [employer].

## 2015-11-07 NOTE — Lactation Note (Signed)
This note was copied from the chart of Meagan Carpenter. Lactation Consultation Note  Follow up visit.  Mom states she just pumped 15 mls and bottle fed it to baby.  I asked her if she would like assist with latching baby.  She declined assist and reports baby latches easily but she is too sore to breastfeed now.  Requesting comfort gels and a pair given with instructions.  Mom has a medela DEBP at home.  Denies questions or concerns.  Lactation outpatient services and support reviewed and encouraged.  Patient Name: Meagan Carpenter [id number] Date: 11/07/2015     Maternal Data    Feeding Feeding Type: Bottle Fed - Formula  LATCH Score/Interventions                      Lactation Tools Discussed/Used     Consult Status      Rock Nephew S 11/07/2015, 10:14 AM

## 2015-11-07 NOTE — Progress Notes (Signed)
SW met with patient prior to discharge. Patient shared history PPD and feeling down currently. SW referring patient for therapy. I had long discussion with patient. She is receptive to starting zoloft so will write for that. Recommending 2-week PP mood check, I will facilitate.

## 2015-11-12 ENCOUNTER — Encounter: Payer: Medicaid Other | Admitting: Certified Nurse Midwife

## 2015-11-24 ENCOUNTER — Encounter: Payer: Self-pay | Admitting: *Deleted

## 2015-11-25 ENCOUNTER — Ambulatory Visit: Payer: Medicaid Other | Admitting: Obstetrics and Gynecology

## 2015-12-17 ENCOUNTER — Ambulatory Visit: Payer: Medicaid Other | Admitting: Obstetrics & Gynecology

## 2015-12-24 ENCOUNTER — Ambulatory Visit (INDEPENDENT_AMBULATORY_CARE_PROVIDER_SITE_OTHER): Payer: Medicaid Other | Admitting: Certified Nurse Midwife

## 2015-12-24 ENCOUNTER — Encounter: Payer: Self-pay | Admitting: Certified Nurse Midwife

## 2015-12-24 DIAGNOSIS — Z01812 Encounter for preprocedural laboratory examination: Secondary | ICD-10-CM | POA: Diagnosis not present

## 2015-12-24 DIAGNOSIS — Z30015 Encounter for initial prescription of vaginal ring hormonal contraceptive: Secondary | ICD-10-CM | POA: Diagnosis not present

## 2015-12-24 LAB — POCT URINE PREGNANCY: Preg Test, Ur: NEGATIVE

## 2015-12-24 NOTE — Progress Notes (Signed)
Subjective:     Meagan Carpenter is a 20 y.o. female who presents for a postpartum visit. She is 6 weeks postpartum following a spontaneous vaginal delivery. I have fully reviewed the prenatal and intrapartum course. The delivery was at 38+2 gestational weeks. Outcome: spontaneous vaginal delivery. Anesthesia: epidural. Postpartum course has been uneventful. Baby's course has been uneventful. Baby is feeding by . Bleeding no bleeding. Bowel function is normal. Bladder function is normal. Patient is sexually active. Contraception method is none. Postpartum depression screening: negative. SCore 5   The following portions of the patient's history were reviewed and updated as appropriate: past surgical history.  Review of Systems Pertinent items are noted in HPI.   Objective:    BP 119/70 mmHg  Pulse 58  Resp 18  Wt 127 lb (57.607 kg)  LMP 12/08/2015  General:  alert and cooperative   Breasts:    Lungs: clear to auscultation bilaterally  Heart:  normal apical impulse  Abdomen: soft, non-tender; bowel sounds normal; no masses,  no organomegaly   Vulva:  not evaluated  Vagina: not evaluated  Cervix:    Corpus:   Adnexa:    Rectal Exam:         Assessment:    6 postpartum exam. Pap smear not done at today's visit.   Plan:    1. Contraception: NuvaRing vaginal inserts 2. Call office when she starts her period for IUD placement 3. Follow up in: 1 year or as needed

## 2016-10-13 ENCOUNTER — Ambulatory Visit: Payer: Medicaid Other | Admitting: Family Medicine

## 2016-10-27 ENCOUNTER — Ambulatory Visit: Payer: Medicaid Other | Admitting: Family Medicine

## 2017-06-22 ENCOUNTER — Ambulatory Visit (INDEPENDENT_AMBULATORY_CARE_PROVIDER_SITE_OTHER): Payer: Medicaid Other | Admitting: Obstetrics & Gynecology

## 2017-06-22 ENCOUNTER — Encounter: Payer: Self-pay | Admitting: Obstetrics & Gynecology

## 2017-06-22 VITALS — BP 138/83 | HR 85 | Ht 65.0 in | Wt 111.0 lb

## 2017-06-22 DIAGNOSIS — N9089 Other specified noninflammatory disorders of vulva and perineum: Secondary | ICD-10-CM | POA: Diagnosis not present

## 2017-06-22 NOTE — Progress Notes (Signed)
   Subjective:    Patient ID: Meagan Carpenter, female    DOB: 1995/11/07, 21 y.o.   MRN: [id number]  HPI 21 yo MW P2 here today with severe vulvar pain for about 3 days. This started the next day after she had 4 hour sex. This has happened in the past and resolved spontaneously.  Review of Systems coitarche 16    Objective:   Physical Exam Well nourished, well hydrated white female, no apparent distress Breathing, conversing, and ambulating normally Vulvar lesions c/w HSV versus 4 hour sex UA negative      Assessment & Plan:  Vulvar pain and abrasions- check [id number] Declines a flu vaccine

## 2017-06-24 LAB — HSV 2 ANTIBODY, IGG: HSV 2 IgG, Type Spec: <0.91 index (ref 0.00–0.90)

## 2017-08-22 ENCOUNTER — Encounter: Payer: Self-pay | Admitting: Obstetrics and Gynecology

## 2017-08-22 ENCOUNTER — Ambulatory Visit (INDEPENDENT_AMBULATORY_CARE_PROVIDER_SITE_OTHER): Payer: Medicaid Other | Admitting: Obstetrics and Gynecology

## 2017-08-22 VITALS — BP 98/56 | Wt 113.0 lb

## 2017-08-22 DIAGNOSIS — Z3A01 Less than 8 weeks gestation of pregnancy: Secondary | ICD-10-CM

## 2017-08-22 DIAGNOSIS — O0991 Supervision of high risk pregnancy, unspecified, first trimester: Secondary | ICD-10-CM | POA: Insufficient documentation

## 2017-08-22 DIAGNOSIS — N760 Acute vaginitis: Secondary | ICD-10-CM

## 2017-08-22 DIAGNOSIS — O09293 Supervision of pregnancy with other poor reproductive or obstetric history, third trimester: Secondary | ICD-10-CM | POA: Insufficient documentation

## 2017-08-22 DIAGNOSIS — O09291 Supervision of pregnancy with other poor reproductive or obstetric history, first trimester: Secondary | ICD-10-CM

## 2017-08-22 DIAGNOSIS — O23591 Infection of other part of genital tract in pregnancy, first trimester: Secondary | ICD-10-CM

## 2017-08-22 MED ORDER — TERCONAZOLE 0.4 % VA CREA: 1.0000 | TOPICAL_CREAM | Freq: Every day | VAGINAL | 1 refills | Status: DC

## 2017-08-22 MED ORDER — PRENATAL VITAMINS 0.8 MG PO TABS: 1.0000 | ORAL_TABLET | Freq: Every day | ORAL | 12 refills | Status: DC

## 2017-08-22 NOTE — Progress Notes (Signed)
08/22/2017   Chief Complaint: Missed period  Transfer of Care Patient: no  History of Present Illness: Ms. Meagan Carpenter is a 21 y.o. [id number] Unknown based on Patient's last menstrual period was 07/12/2017 (exact date). with an Estimated Date of Delivery: 04/18/18 with the above CC.   Her periods were: regular periods every 28 days She was using no method when she conceived.  She has Negative signs or symptoms of nausea/vomiting of pregnancy. She has Negative signs or symptoms of miscarriage or preterm labor She identifies Negative Zika risk factors for her and her partner On any different medications around the time she conceived/early pregnancy: No   ROS: A 12-point review of systems was performed and negative, except as stated in the above HPI.  OBGYN History: As per HPI. OB History  Gravida Para Term Preterm AB Living  4 2 2  0 1 2  SAB TAB Ectopic Multiple Live Births  1 0 0 0 2    # Outcome Date GA Lbr Len/2nd Weight Sex Delivery Anes PTL Lv  4 Current           3 Term 11/05/15 [id number] 05:30 / 01:47 6 lb 8.8 oz (2.97 kg) F Vag-Spont EPI  LIV  2 Term 08/03/14 7544w6d 03:51 / 02:57 7 lb 3 oz (3.26 kg) M Vag-Vacuum EPI  LIV  1 SAB 2014              Any issues with any prior pregnancies: yes. Preeclampsia with both previous pregnancies.  Any prior children are healthy, doing well, without any problems or issues: yes History of pap smears: No. Last pap smear unknown. History of STIs: No   Past Medical History: Past Medical History:  Diagnosis Date  . Chlamydia   . Headache(784.0)   . Pre-eclampsia   . UTI (lower urinary tract infection)     Past Surgical History: Past Surgical History:  Procedure Laterality Date  . ELBOW FRACTURE SURGERY  2012    Family History:  Family History  Problem Relation Age of Onset  . Heart failure Paternal Grandmother        pacemaker  . Hyperlipidemia Paternal Grandmother   . Hypertension Paternal Grandmother   . Cancer Paternal  Grandmother [id number]       great grandmother  . Migraines Paternal Grandmother   . Stroke Paternal Grandfather   . ADD / ADHD Father   . ADD / ADHD Brother   . Autism Other        paternal [place]Great Grandmother & Paternal Earlie RavelingGreat Aunt had Autism   She denies any female cancers, bleeding or blood clotting disorders.  She denies any history of mental retardation, birth defects or genetic disorders in her or the FOB's history  Social History:  Social History   Socioeconomic History  . Marital status: Single    Spouse name: Not on file  . Number of children: Not on file  . Years of education: Not on file  . Highest education level: Not on file  Social Needs  . Financial resource strain: Not on file  . Food insecurity - worry: Not on file  . Food insecurity - inability: Not on file  . Transportation needs - medical: Not on file  . Transportation needs - non-medical: Not on file  Occupational History  . Not on file  Tobacco Use  . Smoking status: Former Smoker    Types: Cigarettes    Last attempt to quit: 01/01/2010    Years since quitting: 7.6  .  Smokeless tobacco: Never Used  Substance and Sexual Activity  . Alcohol use: No    Alcohol/week: 0.0 oz  . Drug use: No  . Sexual activity: Yes    Partners: Male    Birth control/protection: None  Other Topics Concern  . Not on file  Social History Narrative  . Not on file   Any pets in the household: no  Allergy: No Known Allergies  Current Outpatient Medications:  Current Outpatient Medications:  .  Prenatal Multivit-Min-Fe-FA (PRENATAL VITAMINS) 0.8 MG tablet, Take 1 tablet daily by mouth., Disp: 30 tablet, Rfl: 12 .  terconazole (TERAZOL 7) 0.4 % vaginal cream, Place 1 applicator at bedtime vaginally., Disp: 45 g, Rfl: 1   Physical Exam:   BP (!) 98/56   Wt 113 lb (51.3 kg)   LMP 07/12/2017 (Exact Date)   BMI 18.80 kg/m  Body mass index is 18.8 kg/m. Constitutional: Well nourished, well developed female in no acute  distress.  Neck:  Supple, normal appearance, and no thyromegaly  Cardiovascular: S1, S2 normal, no murmur, rub or gallop, regular rate and rhythm Respiratory:  Clear to auscultation bilateral. Normal respiratory effort Abdomen: positive bowel sounds and no masses, hernias; diffusely non tender to palpation, non distended Breasts: breasts appear normal, no suspicious masses, no skin or nipple changes or axillary nodes. Neuro/Psych:  Normal mood and affect.  Skin:  Warm and dry.  Lymphatic:  No inguinal lymphadenopathy.   Pelvic exam: is not limited by body habitus EGBUS: within normal limits, Vagina: within normal limits and with no blood in the vault, Cervix: normal appearing cervix. Thick curd like discharge, thin yellow mucus as well. No lesions, closed/long/high, Uterus:  nonenlarged, and Adnexa:  normal adnexa and no mass, fullness, tenderness  Assessment: Ms. Meagan Carpenter is a 21 y.o. [id number] Unknown based on Patient's last menstrual period was 07/12/2017 (exact date). with an Estimated Date of Delivery: None noted.,  for prenatal care.  Plan:  1) Avoid alcoholic beverages. 2) Patient encouraged not to smoke.  3) Discontinue the use of all non-medicinal drugs and chemicals.  4) Take prenatal vitamins daily.  5) Seatbelt use advised 6) Nutrition, food safety (fish, cheese advisories, and high nitrite foods) and exercise discussed. 7) Hospital and practice style delivering [facility] discussed  8) Patient is asked about travel to areas at risk for the Zika virus, and counseled to avoid travel and exposure to mosquitoes or sexual partners who may have themselves been exposed to the virus. Testing is discussed, and will be ordered as appropriate.  9) [facility] advised 10) Genetic Screening, such as with 1st Trimester Screening, cell free fetal DNA, AFP testing, and Ultrasound, as well as with amniocentesis and CVS as appropriate, is discussed with patient. She plans to have  genetic testing this pregnancy. Desires cell free DNA at 10 weeks. 11) Vaginitis- labs sent, terconazole prescribed. 12) Hx of preeclampsia, discuss starting ASA after [redacted] weeks gestation. Consider baseline CMP at next visit. Return in 1 week for transvaginal ultrasound.  Problem list reviewed and updated.  Adelene Idlerhristanna Schuman MD  [facility], [facility] 08/22/2017  8:10 PM

## 2017-08-24 LAB — URINE DRUG PANEL 7
AMPHETAMINES, URINE: NEGATIVE ng/mL
BARBITURATE QUANT UR: NEGATIVE ng/mL
BENZODIAZEPINE QUANT UR: NEGATIVE ng/mL
Cannabinoid Quant, Ur: NEGATIVE ng/mL
Cocaine (Metab.): NEGATIVE ng/mL
Opiate Quant, Ur: NEGATIVE ng/mL
PCP Quant, Ur: NEGATIVE ng/mL

## 2017-08-25 LAB — URINE CULTURE: Organism ID, Bacteria: NO GROWTH

## 2017-08-25 LAB — PAPIG, CTNGTV, HPV, RFX 16/18
Chlamydia, Nuc. Acid Amp: NEGATIVE
Gonococcus, Nuc. Acid Amp: NEGATIVE
HPV, high-risk: NEGATIVE
PAP SMEAR COMMENT: 0
Trich vag by NAA: NEGATIVE

## 2017-08-30 ENCOUNTER — Ambulatory Visit (INDEPENDENT_AMBULATORY_CARE_PROVIDER_SITE_OTHER): Payer: Medicaid Other

## 2017-08-30 ENCOUNTER — Other Ambulatory Visit: Payer: Self-pay | Admitting: Obstetrics and Gynecology

## 2017-08-30 ENCOUNTER — Encounter: Payer: Self-pay | Admitting: Maternal Newborn

## 2017-08-30 ENCOUNTER — Ambulatory Visit (INDEPENDENT_AMBULATORY_CARE_PROVIDER_SITE_OTHER): Payer: Medicaid Other | Admitting: Maternal Newborn

## 2017-08-30 VITALS — BP 100/70 | Wt 113.0 lb

## 2017-08-30 DIAGNOSIS — O0991 Supervision of high risk pregnancy, unspecified, first trimester: Secondary | ICD-10-CM

## 2017-08-30 DIAGNOSIS — Z3687 Encounter for antenatal screening for uncertain dates: Secondary | ICD-10-CM

## 2017-08-30 DIAGNOSIS — Z3A01 Less than 8 weeks gestation of pregnancy: Secondary | ICD-10-CM | POA: Diagnosis not present

## 2017-08-30 NOTE — Progress Notes (Signed)
No concerns.rj  Wasn't put on schedule for labwork.rj

## 2017-08-30 NOTE — Patient Instructions (Signed)
First Trimester of Pregnancy The first trimester of pregnancy is from week 1 until the end of week 13 (months 1 through 3). A week after a sperm fertilizes an egg, the egg will implant on the wall of the uterus. This embryo will begin to develop into a baby. Genes from you and your partner will form the baby. The female genes will determine whether the baby will be a boy or a girl. At 6-8 weeks, the eyes and face will be formed, and the heartbeat can be seen on ultrasound. At the end of 12 weeks, all the baby's organs will be formed. Now that you are pregnant, you will want to do everything you can to have a healthy baby. Two of the most important things are to get good prenatal care and to follow your health care provider's instructions. Prenatal care is all the medical care you receive before the baby's birth. This care will help prevent, find, and treat any problems during the pregnancy and childbirth. Body changes during your first trimester Your body goes through many changes during pregnancy. The changes vary from woman to woman.  You may gain or lose a couple of pounds at first.  You may feel sick to your stomach (nauseous) and you may throw up (vomit). If the vomiting is uncontrollable, call your health care provider.  You may tire easily.  You may develop headaches that can be relieved by medicines. All medicines should be approved by your health care provider.  You may urinate more often. Painful urination may mean you have a bladder infection.  You may develop heartburn as a result of your pregnancy.  You may develop constipation because certain hormones are causing the muscles that push stool through your intestines to slow down.  You may develop hemorrhoids or swollen veins (varicose veins).  Your breasts may begin to grow larger and become tender. Your nipples may stick out more, and the tissue that surrounds them (areola) may become darker.  Your gums may bleed and may be  sensitive to brushing and flossing.  Dark spots or blotches (chloasma, mask of pregnancy) may develop on your face. This will likely fade after the baby is born.  Your menstrual periods will stop.  You may have a loss of appetite.  You may develop cravings for certain kinds of food.  You may have changes in your emotions from day to day, such as being excited to be pregnant or being concerned that something may go wrong with the pregnancy and baby.  You may have more vivid and strange dreams.  You may have changes in your hair. These can include thickening of your hair, rapid growth, and changes in texture. Some women also have hair loss during or after pregnancy, or hair that feels dry or thin. Your hair will most likely return to normal after your baby is born.  What to expect at prenatal visits During a routine prenatal visit:  You will be weighed to make sure you and the baby are growing normally.  Your blood pressure will be taken.  Your abdomen will be measured to track your baby's growth.  The fetal heartbeat will be listened to between weeks 10 and 14 of your pregnancy.  Test results from any previous visits will be discussed.  Your health care provider may ask you:  How you are feeling.  If you are feeling the baby move.  If you have had any abnormal symptoms, such as leaking fluid, bleeding, severe headaches,   or abdominal cramping.  If you are using any tobacco products, including cigarettes, chewing tobacco, and electronic cigarettes.  If you have any questions.  Other tests that may be performed during your first trimester include:  Blood tests to find your blood type and to check for the presence of any previous infections. The tests will also be used to check for low iron levels (anemia) and protein on red blood cells (Rh antibodies). Depending on your risk factors, or if you previously had diabetes during pregnancy, you may have tests to check for high blood  sugar that affects pregnant women (gestational diabetes).  Urine tests to check for infections, diabetes, or protein in the urine.  An ultrasound to confirm the proper growth and development of the baby.  Fetal screens for spinal cord problems (spina bifida) and Down syndrome.  HIV (human immunodeficiency virus) testing. Routine prenatal testing includes screening for HIV, unless you choose not to have this test.  You may need other tests to make sure you and the baby are doing well.  Follow these instructions at home: Medicines  Follow your health care provider's instructions regarding medicine use. Specific medicines may be either safe or unsafe to take during pregnancy.  Take a prenatal vitamin that contains at least 600 micrograms (mcg) of folic acid.  If you develop constipation, try taking a stool softener if your health care provider approves. Eating and drinking  Eat a balanced diet that includes fresh fruits and vegetables, whole grains, good sources of protein such as meat, eggs, or tofu, and low-fat dairy. Your health care provider will help you determine the amount of weight gain that is right for you.  Avoid raw meat and uncooked cheese. These carry germs that can cause birth defects in the baby.  Eating four or five small meals rather than three large meals a day may help relieve nausea and vomiting. If you start to feel nauseous, eating a few soda crackers can be helpful. Drinking liquids between meals, instead of during meals, also seems to help ease nausea and vomiting.  Limit foods that are high in fat and processed sugars, such as fried and sweet foods.  To prevent constipation: ? Eat foods that are high in fiber, such as fresh fruits and vegetables, whole grains, and beans. ? Drink enough fluid to keep your urine clear or pale yellow. Activity  Exercise only as directed by your health care provider. Most women can continue their usual exercise routine during  pregnancy. Try to exercise for 30 minutes at least 5 days a week. Exercising will help you: ? Control your weight. ? Stay in shape. ? Be prepared for labor and delivery.  Experiencing pain or cramping in the lower abdomen or lower back is a good sign that you should stop exercising. Check with your health care provider before continuing with normal exercises.  Try to avoid standing for long periods of time. Move your legs often if you must stand in one place for a long time.  Avoid heavy lifting.  Wear low-heeled shoes and practice good posture.  You may continue to have sex unless your health care provider tells you not to. Relieving pain and discomfort  Wear a good support bra to relieve breast tenderness.  Take warm sitz baths to soothe any pain or discomfort caused by hemorrhoids. Use hemorrhoid cream if your health care provider approves.  Rest with your legs elevated if you have leg cramps or low back pain.  If you develop   varicose veins in your legs, wear support hose. Elevate your feet for 15 minutes, 3-4 times a day. Limit salt in your diet. Prenatal care  Schedule your prenatal visits by the twelfth week of pregnancy. They are usually scheduled monthly at first, then more often in the last 2 months before delivery.  Write down your questions. Take them to your prenatal visits.  Keep all your prenatal visits as told by your health care provider. This is important. Safety  Wear your seat belt at all times when driving.  Make a list of emergency phone numbers, including numbers for family, friends, the hospital, and police and fire departments. General instructions  Ask your health care provider for a referral to a local prenatal education class. Begin classes no later than the beginning of month 6 of your pregnancy.  Ask for help if you have counseling or nutritional needs during pregnancy. Your health care provider can offer advice or refer you to specialists for help  with various needs.  Do not use hot tubs, steam rooms, or saunas.  Do not douche or use tampons or scented sanitary pads.  Do not cross your legs for long periods of time.  Avoid cat litter boxes and soil used by cats. These carry germs that can cause birth defects in the baby and possibly loss of the fetus by miscarriage or stillbirth.  Avoid all smoking, herbs, alcohol, and medicines not prescribed by your health care provider. Chemicals in these products affect the formation and growth of the baby.  Do not use any products that contain nicotine or tobacco, such as cigarettes and e-cigarettes. If you need help quitting, ask your health care provider. You may receive counseling support and other resources to help you quit.  Schedule a dentist appointment. At home, brush your teeth with a soft toothbrush and be gentle when you floss. Contact a health care provider if:  You have dizziness.  You have mild pelvic cramps, pelvic pressure, or nagging pain in the abdominal area.  You have persistent nausea, vomiting, or diarrhea.  You have a bad smelling vaginal discharge.  You have pain when you urinate.  You notice increased swelling in your face, hands, legs, or ankles.  You are exposed to fifth disease or chickenpox.  You are exposed to German measles (rubella) and have never had it. Get help right away if:  You have a fever.  You are leaking fluid from your vagina.  You have spotting or bleeding from your vagina.  You have severe abdominal cramping or pain.  You have rapid weight gain or loss.  You vomit blood or material that looks like coffee grounds.  You develop a severe headache.  You have shortness of breath.  You have any kind of trauma, such as from a fall or a car accident. Summary  The first trimester of pregnancy is from week 1 until the end of week 13 (months 1 through 3).  Your body goes through many changes during pregnancy. The changes vary from  woman to woman.  You will have routine prenatal visits. During those visits, your health care provider will examine you, discuss any test results you may have, and talk with you about how you are feeling. This information is not intended to replace advice given to you by your health care provider. Make sure you discuss any questions you have with your health care provider. Document Released: 09/14/2001 Document Revised: 09/01/2016 Document Reviewed: 09/01/2016 Elsevier Interactive Patient Education  2017 [employer]   Inc[employer]  

## 2017-08-30 NOTE — Progress Notes (Signed)
    Routine Prenatal Care Visit  Subjective  Meagan Carpenter is a 21 y.o. [phone] at 4969w0d being sTownsend Rogereen today for ongoing prenatal care.  She is currently monitored for the following issues for this high-risk pregnancy and has Tension headache; Migraine headache without aura; Supervision of high risk pregnancy, antepartum, first trimester; and Hx of preeclampsia, prior pregnancy, currently pregnant, first trimester on her problem list.  ----------------------------------------------------------------------------------- Patient reports no complaints.   Vag. Bleeding: None. Denies leaking of fluid.  ----------------------------------------------------------------------------------- The following portions of the patient's history were reviewed and updated as appropriate: allergies, current medications, past family history, past medical history, past social history, past surgical history and problem list. Problem list updated.   Objective  Last menstrual period 07/12/2017, not currently breastfeeding. Pregravid weight 110 lb (49.9 kg) Total Weight Gain 3 lb (1.361 kg) Urinalysis:      Fetal Status: FHR 113 bpm on ultrasound  General:  Alert, oriented and cooperative. Patient is in no acute distress.  Skin: Skin is warm and dry. No rash noted.   Cardiovascular: Normal heart rate noted  Respiratory: Normal respiratory effort, no problems with respiration noted  Abdomen: Soft, gravid, appropriate for gestational age.       Pelvic:  Cervical exam deferred        Extremities: Normal range of motion.  Edema: None  Mental Status: Normal mood and affect. Normal behavior. Normal judgment and thought content.     Assessment   21 y.o. [id number] at 1769w0d, EDD 04/18/2018, by Last Menstrual Period presenting for routine prenatal visit.  Plan   pregnancy#4 Problems (from 07/12/17 to present)    Problem Noted Resolved   Supervision of high risk pregnancy, antepartum, first trimester 08/22/2017 by  Natale MilchSchuman, Christanna R, MD No   Overview Addendum 08/30/2017  3:01 PM by Oswaldo ConroySchmid, Jacelyn Y, CNM    Clinic [facility] Prenatal Labs  Dating  Blood type:     Genetic Screen 1 Screen:    AFP:     Quad:     NIPS: Antibody:   Anatomic US  Rubella:   Varicella:    GTT Early:               Third trimester:  RPR:     Rhogam  HBsAg:     TDaP vaccine                       Flu Shot: HIV:     Baby Food                                GBS:   Contraception  Pap:  CBB     CS/VBAC    Support Person                Ultrasound shows single IUP, size=dates.    Preterm labor symptoms and general obstetric precautions including but not limited to vaginal bleeding, contractions, leaking of fluid and fetal movement were reviewed in detail with the patient.  Please refer to After Visit Summary for other counseling recommendations.   CMP done today. Cell-free fetal DNA next visit and needs NOB labs at that time due to scheduling problem with lab visit.  Return in about 3 weeks (around 09/20/2017) for ROB and genetic testing.  [name], CNM 08/30/2017  4:43 PM

## 2017-08-31 LAB — COMPREHENSIVE METABOLIC PANEL
A/G RATIO: 2.2 (ref 1.2–2.2)
ALBUMIN: 4.6 g/dL (ref 3.5–5.5)
ALT: 8 IU/L (ref 0–32)
AST: 15 IU/L (ref 0–40)
Alkaline Phosphatase: 60 IU/L (ref 39–117)
BILIRUBIN TOTAL: 0.2 mg/dL (ref 0.0–1.2)
BUN / CREAT RATIO: 11 (ref 9–23)
BUN: 6 mg/dL (ref 6–20)
CALCIUM: 9.1 mg/dL (ref 8.7–10.2)
CO2: 22 mmol/L (ref 20–29)
Chloride: 105 mmol/L (ref 96–106)
Creatinine, Ser: 0.56 mg/dL — ABNORMAL LOW (ref 0.57–1.00)
GFR, EST AFRICAN AMERICAN: 154 mL/min/{1.73_m2} (ref >59)
GFR, EST NON AFRICAN AMERICAN: 134 mL/min/{1.73_m2} (ref >59)
GLOBULIN, TOTAL: 2.1 g/dL (ref 1.5–4.5)
Glucose: 93 mg/dL (ref 65–99)
Potassium: 4.4 mmol/L (ref 3.5–5.2)
Sodium: 141 mmol/L (ref 134–144)
TOTAL PROTEIN: 6.7 g/dL (ref 6.0–8.5)

## 2017-09-20 ENCOUNTER — Encounter: Payer: Medicaid Other | Admitting: Maternal Newborn

## 2017-09-22 ENCOUNTER — Encounter: Payer: Medicaid Other | Admitting: Certified Nurse Midwife

## 2017-09-23 ENCOUNTER — Encounter: Payer: Self-pay | Admitting: Maternal Newborn

## 2017-09-23 ENCOUNTER — Ambulatory Visit (INDEPENDENT_AMBULATORY_CARE_PROVIDER_SITE_OTHER): Payer: Medicaid Other | Admitting: Maternal Newborn

## 2017-09-23 VITALS — BP 110/60 | Wt 114.0 lb

## 2017-09-23 DIAGNOSIS — Z3A10 10 weeks gestation of pregnancy: Secondary | ICD-10-CM

## 2017-09-23 DIAGNOSIS — O219 Vomiting of pregnancy, unspecified: Secondary | ICD-10-CM | POA: Insufficient documentation

## 2017-09-23 DIAGNOSIS — O0991 Supervision of high risk pregnancy, unspecified, first trimester: Secondary | ICD-10-CM

## 2017-09-23 DIAGNOSIS — O09291 Supervision of pregnancy with other poor reproductive or obstetric history, first trimester: Secondary | ICD-10-CM

## 2017-09-23 DIAGNOSIS — Z1379 Encounter for other screening for genetic and chromosomal anomalies: Secondary | ICD-10-CM

## 2017-09-23 LAB — OB RESULTS CONSOLE VARICELLA ZOSTER ANTIBODY, IGG: Varicella: IMMUNE

## 2017-09-23 MED ORDER — DOXYLAMINE-PYRIDOXINE 10-10 MG PO TBEC: 2.0000 | DELAYED_RELEASE_TABLET | Freq: Every day | ORAL | 5 refills | Status: DC

## 2017-09-23 MED ORDER — ASPIRIN EC 81 MG PO TBEC: 81.0000 mg | DELAYED_RELEASE_TABLET | Freq: Every day | ORAL | 2 refills | Status: DC

## 2017-09-23 NOTE — Progress Notes (Signed)
    Routine Prenatal Care Visit  Subjective  Meagan Carpenter is a 21 y.o. [phone] at 2144w3d being seen today for ongoing prenatal care.  She is currently monitored for the following issues for this high-risk pregnancy and has Tension headache; Migraine headache without aura; Supervision of high risk pregnancy, antepartum, first trimester; Hx of preeclampsia, prior pregnancy, currently pregnant, first trimester; and Nausea and vomiting during pregnancy prior to [redacted] weeks gestation on their problem list.  ----------------------------------------------------------------------------------- Patient reports low blood pressures and some dizziness. Ongoing nausea and fatigue. Vag. Bleeding: None.  ----------------------------------------------------------------------------------- The following portions of the patient's history were reviewed and updated as appropriate: allergies, current medications, past family history, past medical history, past social history, past surgical history and problem list. Problem list updated.   Objective  Last menstrual period 07/12/2017, not currently breastfeeding. Pregravid weight 110 lb (49.9 kg) Total Weight Gain 4 lb (1.814 kg) Urinalysis: Urine Protein: Negative Urine Glucose: Trace  General:  Alert, oriented and cooperative. Patient is in no acute distress.  Skin: Skin is warm and dry. No rash noted.   Cardiovascular: Normal heart rate noted  Respiratory: Normal respiratory effort, no problems with respiration noted  Abdomen: Soft, gravid, appropriate for gestational age.       Pelvic:  Cervical exam deferred        Extremities: Normal range of motion.  Edema: None  Mental Status: Normal mood and affect. Normal behavior. Normal judgment and thought content.     Assessment   21 y.o. [id number] at 3144w3d, EDD 04/18/2018 by Last Menstrual Period presenting for routine prenatal visit.  Plan   pregnancy#4 Problems (from 07/12/17 to present)    Problem Noted  Resolved   Supervision of high risk pregnancy, antepartum, first trimester 08/22/2017 by Natale MilchSchuman, Christanna R, MD No   Overview Addendum 08/30/2017  3:01 PM by Oswaldo ConroySchmid, [name], CNM    Clinic [facility] Prenatal Labs  Dating  Blood type:     Genetic Screen 1 Screen:    AFP:     Quad:     NIPS: Antibody:   Anatomic US  Rubella:   Varicella:    GTT Early:               Third trimester:  RPR:     Rhogam  HBsAg:     TDaP vaccine                       Flu Shot: HIV:     Baby Food                                GBS:   Contraception  Pap:  CBB     CS/VBAC    Support Person               Materniti21 and NOB labs today. Advised rising and changing positions slowly to prevent dizziness with hypotension. Bonjesta given for nausea. Will start low dose ASA at 12 weeks.  Preterm labor symptoms and general obstetric precautions including but not limited to vaginal bleeding, contractions, leaking of fluid and fetal movement were reviewed in detail with the patient.  Return in about 4 weeks (around 10/21/2017) for ROB.  Marcelyn BruinsJacelyn Makaylah Oddo, CNM 09/23/2017  2:53 PM

## 2017-09-23 NOTE — Progress Notes (Signed)
C/O BP really low like 90/48, 88/50.

## 2017-09-24 LAB — RPR+RH+ABO+RUB AB+AB SCR+CB...
Antibody Screen: NEGATIVE
HEMOGLOBIN: 11.8 g/dL (ref 11.1–15.9)
HIV Screen 4th Generation wRfx: NONREACTIVE
Hematocrit: 33.9 % — ABNORMAL LOW (ref 34.0–46.6)
Hepatitis B Surface Ag: NEGATIVE
MCH: 30.2 pg (ref 26.6–33.0)
MCHC: 34.8 g/dL (ref 31.5–35.7)
MCV: 87 fL (ref 79–97)
Platelets: 291 10*3/uL (ref 150–379)
RBC: 3.91 x10E6/uL (ref 3.77–5.28)
RDW: 14.5 % (ref 12.3–15.4)
RPR Ser Ql: NONREACTIVE
Rh Factor: POSITIVE
Rubella Antibodies, IGG: 1.12 index (ref >0.99)
VARICELLA: 1121 {index} (ref >165)
WBC: 8.4 10*3/uL (ref 3.4–10.8)

## 2017-09-28 ENCOUNTER — Encounter: Payer: Self-pay | Admitting: Obstetrics and Gynecology

## 2017-09-29 ENCOUNTER — Encounter: Payer: Medicaid Other | Admitting: Obstetrics & Gynecology

## 2017-09-30 LAB — MATERNIT 21 PLUS CORE, BLOOD
Chromosome 13: NEGATIVE
Chromosome 18: NEGATIVE
Chromosome 21: NEGATIVE
Y Chromosome: NOT DETECTED

## 2017-10-04 NOTE — L&D Delivery Note (Addendum)
Vaginal Delivery Note  Spontaneous delivery of live viable female infant from the LOA position through an intact perineum. Delivery of anterior right shoulder with gentle downward guidance followed by delivery of the left posterior shoulder with gentle upward guidance. Body followed spontaneously. Infant placed on maternal chest. Nursery present and helped with neonatal resuscitation and evaluation. Cord clamped and cut after one minute. Cord blood collected. Placenta delivered spontaneously and intact with a 3 vessel cord.  No lacerations. Uterus initially boggy with brisk bleeding. Massage, pitocin, Cytotec and methergine given. Bleeding quickly improved and the fundus was firm and below umbilicus at the end of the delivery.  Mom and baby recovering in stable condition. Sponge and needle counts were correct at the end of the delivery.  APGARS: 1 minute:8 5 minutes: 9 Name: [facility]  Meagan Idlerhristanna Mikhi Athey MD [facility], [facility] 04/13/18 9:34 PM

## 2017-10-19 ENCOUNTER — Ambulatory Visit (INDEPENDENT_AMBULATORY_CARE_PROVIDER_SITE_OTHER): Payer: Medicaid Other | Admitting: Obstetrics & Gynecology

## 2017-10-19 VITALS — BP 100/60 | Wt 118.0 lb

## 2017-10-19 DIAGNOSIS — Z3A14 14 weeks gestation of pregnancy: Secondary | ICD-10-CM

## 2017-10-19 DIAGNOSIS — O0991 Supervision of high risk pregnancy, unspecified, first trimester: Secondary | ICD-10-CM

## 2017-10-19 DIAGNOSIS — O09291 Supervision of pregnancy with other poor reproductive or obstetric history, first trimester: Secondary | ICD-10-CM

## 2017-10-19 MED ORDER — ONDANSETRON 4 MG PO TBDP: 4.0000 mg | ORAL_TABLET | Freq: Four times a day (QID) | ORAL | 2 refills | Status: DC | PRN

## 2017-10-19 NOTE — Patient Instructions (Signed)

## 2017-10-19 NOTE — Progress Notes (Signed)
  Subjective  Fetal Movement? yes Contractions? no Leaking Fluid? no Vaginal Bleeding? no Some nausea, unable to afford Diclegis, does not want to wait for PA Objective  BP 100/60   Wt 118 lb (53.5 kg)   LMP 07/12/2017 (Exact Date)   BMI 19.64 kg/m  General: NAD Pumonary: no increased work of breathing Abdomen: gravid, non-tender Extremities: no edema Psychiatric: mood appropriate, affect full  Assessment  21 y.o. [id number] at 2439w1d by  04/18/2018, by Last Menstrual Period presenting for routine prenatal visit  Plan   Problem List Items Addressed This Visit      Other   Supervision of high risk pregnancy, antepartum, first trimester   Hx of preeclampsia, prior pregnancy, currently pregnant, first trimester    Other Visit Diagnoses    [redacted] weeks gestation of pregnancy    -  Primary    Zofran US nv PNV  Annamarie MajorPaul Vivan Agostino, MD, Merlinda FrederickFACOG [facility], [facility] 10/19/2017  2:45 PM

## 2017-10-21 ENCOUNTER — Encounter: Payer: Medicaid Other | Admitting: Maternal Newborn

## 2017-11-21 ENCOUNTER — Other Ambulatory Visit: Payer: Self-pay | Admitting: Obstetrics & Gynecology

## 2017-11-21 DIAGNOSIS — Z363 Encounter for antenatal screening for malformations: Secondary | ICD-10-CM

## 2017-11-23 ENCOUNTER — Ambulatory Visit (INDEPENDENT_AMBULATORY_CARE_PROVIDER_SITE_OTHER): Payer: Medicaid Other

## 2017-11-23 ENCOUNTER — Ambulatory Visit (INDEPENDENT_AMBULATORY_CARE_PROVIDER_SITE_OTHER): Payer: Medicaid Other | Admitting: Maternal Newborn

## 2017-11-23 ENCOUNTER — Encounter: Payer: Self-pay | Admitting: Maternal Newborn

## 2017-11-23 VITALS — BP 102/60 | Wt 123.0 lb

## 2017-11-23 DIAGNOSIS — O0991 Supervision of high risk pregnancy, unspecified, first trimester: Secondary | ICD-10-CM

## 2017-11-23 DIAGNOSIS — Z363 Encounter for antenatal screening for malformations: Secondary | ICD-10-CM | POA: Diagnosis not present

## 2017-11-23 DIAGNOSIS — Z3A19 19 weeks gestation of pregnancy: Secondary | ICD-10-CM

## 2017-11-23 DIAGNOSIS — O26892 Other specified pregnancy related conditions, second trimester: Secondary | ICD-10-CM

## 2017-11-23 DIAGNOSIS — R51 Headache: Secondary | ICD-10-CM

## 2017-11-23 MED ORDER — BUTALBITAL-APAP-CAFFEINE 50-325-40 MG PO CAPS: 1.0000 | ORAL_CAPSULE | Freq: Four times a day (QID) | ORAL | 3 refills | Status: DC | PRN

## 2017-11-23 NOTE — Progress Notes (Signed)
    Routine Prenatal Care Visit  Subjective  Meagan Carpenter is a 22 y.o. [phone] at 7038w1d being seen today for ongoing prenatal care.  She is currently monitored for the following issues for this high-risk pregnancy and has Tension headache; Migraine headache without aura; Supervision of high risk pregnancy, antepartum, first trimester; Hx of preeclampsia, prior pregnancy, currently pregnant, first trimester; and Nausea and vomiting during pregnancy prior to [redacted] weeks gestation on their problem list.  ----------------------------------------------------------------------------------- Patient reports headache. She has been having migraines and also has some symptoms of acute sinus infection (nasal stuffiness, pressure in face, green mucus) that are making her headaches more frequent. No fevers or chest congestion. Vag. Bleeding: None.  Movement: Present. Denies leaking of fluid.  ----------------------------------------------------------------------------------- The following portions of the patient's history were reviewed and updated as appropriate: allergies, current medications, past family history, past medical history, past social history, past surgical history and problem list. Problem list updated.   Objective  Last menstrual period 07/12/2017. Pregravid weight 110 lb (49.9 kg) Total Weight Gain 13 lb (5.897 kg) Urinalysis:      Fetal Status: Fetal Heart Rate (bpm): 152   Movement: Present     General:  Alert, oriented and cooperative. Patient is in no acute distress.  Skin: Skin is warm and dry. No rash noted.   Cardiovascular: Normal heart rate noted  Respiratory: Normal respiratory effort, no problems with respiration noted  Abdomen: Soft, gravid, appropriate for gestational age. Pain/Pressure: Absent     Pelvic:  Cervical exam deferred        Extremities: Normal range of motion.     Mental Status: Normal mood and affect. Normal behavior. Normal judgment and thought content.      Assessment   21 y.o. [id number] at 4138w1d, EDD 04/18/2018 by Last Menstrual Period presenting for routine prenatal visit.  Plan   pregnancy#4 Problems (from 07/12/17 to present)    Problem Noted Resolved   Supervision of high risk pregnancy, antepartum, first trimester 08/22/2017 by Natale MilchSchuman, Christanna R, MD No   Overview Addendum 09/30/2017  4:48 PM by Oswaldo ConroySchmid, Chamille Werntz Y, CNM    Clinic [facility] Prenatal Labs  Dating L=7 Blood type: O Positive  Genetic Screen NIPS: Negative, it's a girl! Antibody: Negative  Anatomic US  Rubella: Immune  Varicella: Immune  GTT Early:               Third trimester:  RPR: NR  Rhogam  HBsAg: Negative  TDaP vaccine                       Flu Shot: HIV: NR  Baby Food                                GBS:   Contraception  Pap: 08/22/2017 NILM/HPV Negative  CBB     CS/VBAC    Support Person               Anatomic survey is complete today. Rx given for Fioricet to see if it relieves headaches. Discussed that antibiotics usually not indicated for acute sinus infection but to return if sx worsen.  Preterm labor symptoms and general obstetric precautions including but not limited to vaginal bleeding, contractions, leaking of fluid and fetal movement were reviewed in detail with the patient.  Return in about 4 weeks (around 12/21/2017) for ROB.  Marcelyn BruinsJacelyn Trevino Wyatt, CNM 11/23/2017  3:14 PM

## 2017-11-23 NOTE — Progress Notes (Signed)
C/o migraines; green mucous - stopped up. rj

## 2017-12-21 ENCOUNTER — Ambulatory Visit (INDEPENDENT_AMBULATORY_CARE_PROVIDER_SITE_OTHER): Payer: Medicaid Other | Admitting: Maternal Newborn

## 2017-12-21 ENCOUNTER — Encounter: Payer: Self-pay | Admitting: Maternal Newborn

## 2017-12-21 VITALS — BP 100/60 | Wt 130.0 lb

## 2017-12-21 DIAGNOSIS — Z3A23 23 weeks gestation of pregnancy: Secondary | ICD-10-CM

## 2017-12-21 DIAGNOSIS — H6690 Otitis media, unspecified, unspecified ear: Secondary | ICD-10-CM

## 2017-12-21 DIAGNOSIS — O0991 Supervision of high risk pregnancy, unspecified, first trimester: Secondary | ICD-10-CM

## 2017-12-21 MED ORDER — AMOXICILLIN 500 MG PO CAPS: 500.0000 mg | ORAL_CAPSULE | Freq: Three times a day (TID) | ORAL | 0 refills | Status: AC

## 2017-12-21 NOTE — Progress Notes (Signed)
No concerns.rj 

## 2017-12-21 NOTE — Progress Notes (Signed)
    Routine Prenatal Care Visit  Subjective  Meagan Carpenter is a 22 y.o. [phone] at 7669w1d being seen today for ongoing prenatal care.  She is currently monitored for the following issues for this high-risk pregnancy and has Tension headache; Migraine headache without aura; Supervision of high risk pregnancy, antepartum, first trimester; Hx of preeclampsia, prior pregnancy, currently pregnant, first trimester; and Nausea and vomiting during pregnancy prior to [redacted] weeks gestation on their problem list.  ----------------------------------------------------------------------------------- Patient reports bilateral ear pain, sinus congestion.   Vag. Bleeding: None.  Movement: Present. Denies leaking of fluid.  ----------------------------------------------------------------------------------- The following portions of the patient's history were reviewed and updated as appropriate: allergies, current medications, past family history, past medical history, past social history, past surgical history and problem list. Problem list updated.   Objective  Last menstrual period 07/12/2017. Pregravid weight 110 lb (49.9 kg) Total Weight Gain 20 lb (9.072 kg) Urinalysis: Urine Protein: Negative Urine Glucose: Trace  Fetal Status: Fetal Heart Rate (bpm): 148 Fundal Height: 21 cm Movement: Present     General:  Alert, oriented and cooperative. Patient is in no acute distress.  Skin: Skin is warm and dry. No rash noted.   Cardiovascular: Normal heart rate noted  Respiratory: Normal respiratory effort, no problems with respiration noted  Abdomen: Soft, gravid, appropriate for gestational age. Pain/Pressure: Absent     Pelvic:  Cervical exam deferred        Extremities: Normal range of motion.     Mental Status: Normal mood and affect. Normal behavior. Normal judgment and thought content.     Assessment   22 y.o. [id number] at 5269w1d, EDD 04/18/2018 by Last Menstrual Period presenting for routine prenatal  visit  Plan   pregnancy#4 Problems (from 07/12/17 to present)    Problem Noted Resolved   Supervision of high risk pregnancy, antepartum, first trimester 08/22/2017 by Meagan Carpenter, Meagan R, MD No   Overview Addendum 09/30/2017  4:48 PM by Meagan Carpenter, Meagan Y, CNM    [facility] Prenatal Labs  Dating L=7 Blood type: O Positive  Genetic Screen NIPS: Negative, it's a girl! Antibody: Negative  Anatomic US  Rubella: Immune  Varicella: Immune  GTT Early:               Third trimester:  RPR: NR  Rhogam  HBsAg: Negative  TDaP vaccine                       Flu Shot: HIV: NR  Baby Food                                GBS:   Contraception  Pap: 08/22/2017 NILM/HPV Negative  CBB     CS/VBAC    Support Person                 Previously treated for ear infection but may not have completed antibiotics. Ear pain is back and severe enough that she is crying when it comes on. Rx for amoxicillin sent to pharmacy and patient advised to complete all doses.  Gestational age appropriate labor symptoms and general obstetric precautions including but not limited to vaginal bleeding, contractions, leaking of fluid and fetal movement were reviewed with the patient.  Return in about 4 weeks (around 01/18/2018) for ROB with GTT/28 week labs.  Meagan Carpenter, CNM 12/21/2017  2:20 PM

## 2018-01-18 ENCOUNTER — Encounter: Payer: Medicaid Other | Admitting: Certified Nurse Midwife

## 2018-01-18 ENCOUNTER — Other Ambulatory Visit: Payer: Medicaid Other

## 2018-01-18 ENCOUNTER — Ambulatory Visit (INDEPENDENT_AMBULATORY_CARE_PROVIDER_SITE_OTHER): Payer: Medicaid Other | Admitting: Certified Nurse Midwife

## 2018-01-18 VITALS — BP 102/62 | Wt 134.0 lb

## 2018-01-18 DIAGNOSIS — O0991 Supervision of high risk pregnancy, unspecified, first trimester: Secondary | ICD-10-CM

## 2018-01-18 DIAGNOSIS — Z3A27 27 weeks gestation of pregnancy: Secondary | ICD-10-CM

## 2018-01-18 NOTE — Progress Notes (Signed)
ROB at 27wk1d: Doing well. No concerns. +FM Wants to breast feed 28 week labs today. O POS blood type Given schedule for tours at L&D and sibling class. Discussed cord blood banking-public and private ROB in 3 weeks TDAP in 3 weeks Meagan Carpenter, [place]

## 2018-01-18 NOTE — Progress Notes (Signed)
Pt reports no problems. 28 wk labs today.  

## 2018-01-19 LAB — 28 WEEK RH+PANEL
BASOS ABS: 0 10*3/uL (ref 0.0–0.2)
Basos: 0 %
EOS (ABSOLUTE): 0.1 10*3/uL (ref 0.0–0.4)
Eos: 2 %
GESTATIONAL DIABETES SCREEN: 95 mg/dL (ref 65–139)
HIV Screen 4th Generation wRfx: NONREACTIVE
Hematocrit: 29.8 % — ABNORMAL LOW (ref 34.0–46.6)
Hemoglobin: 9.6 g/dL — ABNORMAL LOW (ref 11.1–15.9)
IMMATURE GRANULOCYTES: 0 %
Immature Grans (Abs): 0 10*3/uL (ref 0.0–0.1)
Lymphocytes Absolute: 1.2 10*3/uL (ref 0.7–3.1)
Lymphs: 16 %
MCH: 27.6 pg (ref 26.6–33.0)
MCHC: 32.2 g/dL (ref 31.5–35.7)
MCV: 86 fL (ref 79–97)
Monocytes Absolute: 0.5 10*3/uL (ref 0.1–0.9)
Monocytes: 6 %
NEUTROS PCT: 76 %
Neutrophils Absolute: 6 10*3/uL (ref 1.4–7.0)
PLATELETS: 299 10*3/uL (ref 150–379)
RBC: 3.48 x10E6/uL — ABNORMAL LOW (ref 3.77–5.28)
RDW: 13.3 % (ref 12.3–15.4)
RPR: NONREACTIVE
WBC: 8 10*3/uL (ref 3.4–10.8)

## 2018-01-24 ENCOUNTER — Other Ambulatory Visit: Payer: Self-pay | Admitting: Maternal Newborn

## 2018-01-24 DIAGNOSIS — O99013 Anemia complicating pregnancy, third trimester: Secondary | ICD-10-CM

## 2018-01-24 MED ORDER — FERROUS SULFATE 325 (65 FE) MG PO TABS: 325.0000 mg | ORAL_TABLET | Freq: Every day | ORAL | 4 refills | Status: DC

## 2018-02-08 ENCOUNTER — Ambulatory Visit (INDEPENDENT_AMBULATORY_CARE_PROVIDER_SITE_OTHER): Payer: Medicaid Other | Admitting: Maternal Newborn

## 2018-02-08 ENCOUNTER — Encounter: Payer: Self-pay | Admitting: Maternal Newborn

## 2018-02-08 VITALS — BP 102/64 | Wt 139.0 lb

## 2018-02-08 DIAGNOSIS — Z23 Encounter for immunization: Secondary | ICD-10-CM

## 2018-02-08 DIAGNOSIS — O99013 Anemia complicating pregnancy, third trimester: Secondary | ICD-10-CM

## 2018-02-08 DIAGNOSIS — O0991 Supervision of high risk pregnancy, unspecified, first trimester: Secondary | ICD-10-CM

## 2018-02-08 DIAGNOSIS — Z3A30 30 weeks gestation of pregnancy: Secondary | ICD-10-CM | POA: Diagnosis not present

## 2018-02-08 NOTE — Progress Notes (Signed)
ROB TDAP given 

## 2018-02-08 NOTE — Progress Notes (Signed)
Routine Prenatal Care Visit  Subjective  Meagan Carpenter is a 22 y.o. [phone] at [redacted]w[redacted]d being seen today for ongoing prenatal care.  She is currently monitored for the following issues for this high-risk pregnancy and has Tension headache; Migraine headache without aura; Supervision of high risk pregnancy, antepartum, first trimester; Hx of preeclampsia, prior pregnancy, currently pregnant, first trimester; Nausea and vomiting during pregnancy prior to [redacted] weeks gestation; and Anemia affecting pregnancy in third trimester on their problem list.  ----------------------------------------------------------------------------------- Patient reports occasional pain on her right side, especially when waking from sleeping on her side.   Contractions: Not present. Vag. Bleeding: None.  Movement: Present. Denies leaking of fluid.  ----------------------------------------------------------------------------------- The following portions of the patient's history were reviewed and updated as appropriate: allergies, current medications, past family history, past medical history, past social history, past surgical history and problem list. Problem list updated.   Objective  Blood pressure 102/64, weight 139 lb (63 kg), last menstrual period 07/12/2017. Pregravid weight 110 lb (49.9 kg) Total Weight Gain 29 lb (13.2 kg) Urinalysis: Urine Protein: Negative Urine Glucose: Trace  Fetal Status: Fetal Heart Rate (bpm): 147 Fundal Height: 30 cm Movement: Present     General:  Alert, oriented and cooperative. Patient is in no acute distress.  Skin: Skin is warm and dry. No rash noted.   Cardiovascular: Normal heart rate noted  Respiratory: Normal respiratory effort, no problems with respiration noted  Abdomen: Soft, gravid, appropriate for gestational age. Pain/Pressure: Absent     Pelvic:  Cervical exam deferred        Extremities: Normal range of motion.     Mental Status: Normal mood and affect. Normal  behavior. Normal judgment and thought content.     Assessment   21 y.o. [name] at [redacted]w[redacted]d, EDD 04/18/2018 by Last Menstrual Period presenting for routine prenatal visit.  Plan   pregnancy#4 Problems (from 07/12/17 to present)    Problem Noted Resolved   Anemia affecting pregnancy in third trimester 01/24/2018 by Oswaldo Conroy, CNM No   Supervision of high risk pregnancy, antepartum, first trimester 08/22/2017 by Natale Milch, MD No   Overview Addendum 09/30/2017  4:48 PM by Oswaldo Conroy, CNM    [facility] Prenatal Labs  Dating L=7 Blood type: O Positive  Genetic Screen NIPS: Negative, it's a girl! Antibody: Negative  Anatomic [place]  Rubella: Immune  Varicella: Immune  GTT Early:               Third trimester:  RPR: NR  Rhogam  HBsAg: Negative  TDaP vaccine                       Flu Shot: HIV: NR  Baby Food                                GBS:   Contraception  Pap: 08/22/2017 NILM/HPV Negative  CBB     CS/VBAC    Support Person               Discussed comfort measures for round ligament/muscule pain. TDaP given today. Reviewed 28 week lab results. Encouraged iron supplementation for anemia and 4 week sample pack given.  Preterm labor symptoms and general obstetric precautions including but not limited to vaginal bleeding, contractions, leaking of fluid and fetal movement were reviewed.  Please refer to After Visit Summary for other counseling recommendations.  Return in about 2 weeks (around 02/22/2018) for ROB.  Meagan Carpenter, CNM 02/08/2018  2:29 PM

## 2018-02-08 NOTE — Patient Instructions (Signed)
Third Trimester of Pregnancy The third trimester is from week 28 through week 40 (months 7 through 9). The third trimester is a time when the unborn baby (fetus) is growing rapidly. At the end of the ninth month, the fetus is about 20 inches in length and weighs 6-10 pounds. Body changes during your third trimester Your body will continue to go through many changes during pregnancy. The changes vary from woman to woman. During the third trimester:  Your weight will continue to increase. You can expect to gain 25-35 pounds (11-16 kg) by the end of the pregnancy.  You may begin to get stretch marks on your hips, abdomen, and breasts.  You may urinate more often because the fetus is moving lower into your pelvis and pressing on your bladder.  You may develop or continue to have heartburn. This is caused by increased hormones that slow down muscles in the digestive tract.  You may develop or continue to have constipation because increased hormones slow digestion and cause the muscles that push waste through your intestines to relax.  You may develop hemorrhoids. These are swollen veins (varicose veins) in the rectum that can itch or be painful.  You may develop swollen, bulging veins (varicose veins) in your legs.  You may have increased body aches in the pelvis, back, or thighs. This is due to weight gain and increased hormones that are relaxing your joints.  You may have changes in your hair. These can include thickening of your hair, rapid growth, and changes in texture. Some women also have hair loss during or after pregnancy, or hair that feels dry or thin. Your hair will most likely return to normal after your baby is born.  Your breasts will continue to grow and they will continue to become tender. A yellow fluid (colostrum) may leak from your breasts. This is the first milk you are producing for your baby.  Your belly button may stick out.  You may notice more swelling in your hands,  face, or ankles.  You may have increased tingling or numbness in your hands, arms, and legs. The skin on your belly may also feel numb.  You may feel short of breath because of your expanding uterus.  You may have more problems sleeping. This can be caused by the size of your belly, increased need to urinate, and an increase in your body's metabolism.  You may notice the fetus "dropping," or moving lower in your abdomen (lightening).  You may have increased vaginal discharge.  You may notice your joints feel loose and you may have pain around your pelvic bone.  What to expect at prenatal visits You will have prenatal exams every 2 weeks until week 36. Then you will have weekly prenatal exams. During a routine prenatal visit:  You will be weighed to make sure you and the baby are growing normally.  Your blood pressure will be taken.  Your abdomen will be measured to track your baby's growth.  The fetal heartbeat will be listened to.  Any test results from the previous visit will be discussed.  You may have a cervical check near your due date to see if your cervix has softened or thinned (effaced).  You will be tested for Group B streptococcus. This happens between 35 and 37 weeks.  Your health care provider may ask you:  What your birth plan is.  How you are feeling.  If you are feeling the baby move.  If you have had   any abnormal symptoms, such as leaking fluid, bleeding, severe headaches, or abdominal cramping.  If you are using any tobacco products, including cigarettes, chewing tobacco, and electronic cigarettes.  If you have any questions.  Other tests or screenings that may be performed during your third trimester include:  Blood tests that check for low iron levels (anemia).  Fetal testing to check the health, activity level, and growth of the fetus. Testing is done if you have certain medical conditions or if there are problems during the  pregnancy.  Nonstress test (NST). This test checks the health of your baby to make sure there are no signs of problems, such as the baby not getting enough oxygen. During this test, a belt is placed around your belly. The baby is made to move, and its heart rate is monitored during movement.  What is false labor? False labor is a condition in which you feel small, irregular tightenings of the muscles in the womb (contractions) that usually go away with rest, changing position, or drinking water. These are called Braxton Hicks contractions. Contractions may last for hours, days, or even weeks before true labor sets in. If contractions come at regular intervals, become more frequent, increase in intensity, or become painful, you should see your health care provider. What are the signs of labor?  Abdominal cramps.  Regular contractions that start at 10 minutes apart and become stronger and more frequent with time.  Contractions that start on the top of the uterus and spread down to the lower abdomen and back.  Increased pelvic pressure and dull back pain.  A watery or bloody mucus discharge that comes from the vagina.  Leaking of amniotic fluid. This is also known as your "water breaking." It could be a slow trickle or a gush. Let your health care provider know if it has a color or strange odor. If you have any of these signs, call your health care provider right away, even if it is before your due date. Follow these instructions at home: Medicines  Follow your health care provider's instructions regarding medicine use. Specific medicines may be either safe or unsafe to take during pregnancy.  Take a prenatal vitamin that contains at least 600 micrograms (mcg) of folic acid.  If you develop constipation, try taking a stool softener if your health care provider approves. Eating and drinking  Eat a balanced diet that includes fresh fruits and vegetables, whole grains, good sources of protein  such as meat, eggs, or tofu, and low-fat dairy. Your health care provider will help you determine the amount of weight gain that is right for you.  Avoid raw meat and uncooked cheese. These carry germs that can cause birth defects in the baby.  If you have low calcium intake from food, talk to your health care provider about whether you should take a daily calcium supplement.  Eat four or five small meals rather than three large meals a day.  Limit foods that are high in fat and processed sugars, such as fried and sweet foods.  To prevent constipation: ? Drink enough fluid to keep your urine clear or pale yellow. ? Eat foods that are high in fiber, such as fresh fruits and vegetables, whole grains, and beans. Activity  Exercise only as directed by your health care provider. Most women can continue their usual exercise routine during pregnancy. Try to exercise for 30 minutes at least 5 days a week. Stop exercising if you experience uterine contractions.  Avoid heavy   lifting.  Do not exercise in extreme heat or humidity, or at high altitudes.  Wear low-heel, comfortable shoes.  Practice good posture.  You may continue to have sex unless your health care provider tells you otherwise. Relieving pain and discomfort  Take frequent breaks and rest with your legs elevated if you have leg cramps or low back pain.  Take warm sitz baths to soothe any pain or discomfort caused by hemorrhoids. Use hemorrhoid cream if your health care provider approves.  Wear a good support bra to prevent discomfort from breast tenderness.  If you develop varicose veins: ? Wear support pantyhose or compression stockings as told by your healthcare provider. ? Elevate your feet for 15 minutes, 3-4 times a day. Prenatal care  Write down your questions. Take them to your prenatal visits.  Keep all your prenatal visits as told by your health care provider. This is important. Safety  Wear your seat belt at  all times when driving.  Make a list of emergency phone numbers, including numbers for family, friends, the hospital, and police and fire departments. General instructions  Avoid cat litter boxes and soil used by cats. These carry germs that can cause birth defects in the baby. If you have a cat, ask someone to clean the litter box for you.  Do not travel far distances unless it is absolutely necessary and only with the approval of your health care provider.  Do not use hot tubs, steam rooms, or saunas.  Do not drink alcohol.  Do not use any products that contain nicotine or tobacco, such as cigarettes and e-cigarettes. If you need help quitting, ask your health care provider.  Do not use any medicinal herbs or unprescribed drugs. These chemicals affect the formation and growth of the baby.  Do not douche or use tampons or scented sanitary pads.  Do not cross your legs for long periods of time.  To prepare for the arrival of your baby: ? Take prenatal classes to understand, practice, and ask questions about labor and delivery. ? Make a trial run to the hospital. ? Visit the hospital and tour the maternity area. ? Arrange for maternity or paternity leave through employers. ? Arrange for family and friends to take care of pets while you are in the hospital. ? Purchase a rear-facing car seat and make sure you know how to install it in your car. ? Pack your hospital bag. ? Prepare the baby's nursery. Make sure to remove all pillows and stuffed animals from the baby's crib to prevent suffocation.  Visit your dentist if you have not gone during your pregnancy. Use a soft toothbrush to brush your teeth and be gentle when you floss. Contact a health care provider if:  You are unsure if you are in labor or if your water has broken.  You become dizzy.  You have mild pelvic cramps, pelvic pressure, or nagging pain in your abdominal area.  You have lower back pain.  You have persistent  nausea, vomiting, or diarrhea.  You have an unusual or bad smelling vaginal discharge.  You have pain when you urinate. Get help right away if:  Your water breaks before 37 weeks.  You have regular contractions less than 5 minutes apart before 37 weeks.  You have a fever.  You are leaking fluid from your vagina.  You have spotting or bleeding from your vagina.  You have severe abdominal pain or cramping.  You have rapid weight loss or weight gain.    You have shortness of breath with chest pain.  You notice sudden or extreme swelling of your face, hands, ankles, feet, or legs.  Your baby makes fewer than 10 movements in 2 hours.  You have severe headaches that do not go away when you take medicine.  You have vision changes. Summary  The third trimester is from week 28 through week 40, months 7 through 9. The third trimester is a time when the unborn baby (fetus) is growing rapidly.  During the third trimester, your discomfort may increase as you and your baby continue to gain weight. You may have abdominal, leg, and back pain, sleeping problems, and an increased need to urinate.  During the third trimester your breasts will keep growing and they will continue to become tender. A yellow fluid (colostrum) may leak from your breasts. This is the first milk you are producing for your baby.  False labor is a condition in which you feel small, irregular tightenings of the muscles in the womb (contractions) that eventually go away. These are called Braxton Hicks contractions. Contractions may last for hours, days, or even weeks before true labor sets in.  Signs of labor can include: abdominal cramps; regular contractions that start at 10 minutes apart and become stronger and more frequent with time; watery or bloody mucus discharge that comes from the vagina; increased pelvic pressure and dull back pain; and leaking of amniotic fluid. This information is not intended to replace advice  given to you by your health care provider. Make sure you discuss any questions you have with your health care provider. Document Released: 09/14/2001 Document Revised: 02/26/2016 Document Reviewed: 11/21/2012 Elsevier Interactive Patient Education  2017 [employer]  

## 2018-02-22 ENCOUNTER — Encounter: Payer: Self-pay | Admitting: Maternal Newborn

## 2018-02-22 ENCOUNTER — Ambulatory Visit (INDEPENDENT_AMBULATORY_CARE_PROVIDER_SITE_OTHER): Payer: Medicaid Other | Admitting: Maternal Newborn

## 2018-02-22 VITALS — BP 102/62 | Wt 142.0 lb

## 2018-02-22 DIAGNOSIS — O0991 Supervision of high risk pregnancy, unspecified, first trimester: Secondary | ICD-10-CM

## 2018-02-22 DIAGNOSIS — Z3A32 32 weeks gestation of pregnancy: Secondary | ICD-10-CM

## 2018-02-22 NOTE — Progress Notes (Signed)
    Routine Prenatal Care Visit  Subjective  Meagan Carpenter is a 22 y.o. [phone] at [redacted]w[redacted]d being seen today for ongoing prenatal care.  She is currently monitored for the following issues for this high-risk pregnancy and has Tension headache; Migraine headache without aura; Supervision of high risk pregnancy, antepartum, first trimester; Hx of preeclampsia, prior pregnancy, currently pregnant, first trimester; Nausea and vomiting during pregnancy prior to [redacted] weeks gestation; and Anemia affecting pregnancy in third trimester on their problem list.  ----------------------------------------------------------------------------------- Patient reports fatigue; some painful contractions on Saturday that resolved with rest. Contractions: Not present. Vag. Bleeding: None.  Movement: Present. No leaking of fluid.  ----------------------------------------------------------------------------------- The following portions of the patient's history were reviewed and updated as appropriate: allergies, current medications, past family history, past medical history, past social history, past surgical history and problem list. Problem list updated.   Objective  Blood pressure 102/62, weight 142 lb (64.4 kg), last menstrual period 07/12/2017. Pregravid weight 110 lb (49.9 kg) Total Weight Gain 32 lb (14.5 kg) Urinalysis: Urine Protein: Negative Urine Glucose: 1+  Fetal Status: Fetal Heart Rate (bpm): 150 Fundal Height: 32 cm Movement: Present     General:  Alert, oriented and cooperative. Patient is in no acute distress.  Skin: Skin is warm and dry. No rash noted.   Cardiovascular: Normal heart rate noted  Respiratory: Normal respiratory effort, no problems with respiration noted  Abdomen: Soft, gravid, appropriate for gestational age. Pain/Pressure: Absent     Pelvic:  Cervical exam deferred        Extremities: Normal range of motion.     Mental Status: Normal mood and affect. Normal behavior. Normal  judgment and thought content.     Assessment   21 y.o. [name] at 101w1d, EDD 04/18/2018 by Last Menstrual Period presenting for routine prenatal visit.  Plan   pregnancy#4 Problems (from 07/12/17 to present)    Problem Noted Resolved   Anemia affecting pregnancy in third trimester 01/24/2018 by Oswaldo Conroy, CNM No   Supervision of high risk pregnancy, antepartum, first trimester 08/22/2017 by Natale Milch, MD No   Overview Addendum 09/30/2017  4:48 PM by Oswaldo Conroy, CNM    [facility] Prenatal Labs  Dating L=7 Blood type: O Positive  Genetic Screen NIPS: Negative, it's a girl! Antibody: Negative  Anatomic [place]  Rubella: Immune  Varicella: Immune  GTT Early:               Third trimester:  RPR: NR  Rhogam  HBsAg: Negative  TDaP vaccine                       Flu Shot: HIV: NR  Baby Food                                GBS:   Contraception  Pap: 08/22/2017 NILM/HPV Negative  CBB     CS/VBAC    Support Person                 Preterm labor symptoms and general obstetric precautions including but not limited to vaginal bleeding, contractions, leaking of fluid and fetal movement were reviewed.  Please refer to After Visit Summary for other counseling recommendations.   Return in about 2 weeks (around 03/08/2018) for ROB.  Marcelyn Bruins, CNM 02/22/2018  2:45 PM

## 2018-02-22 NOTE — Patient Instructions (Signed)
Third Trimester of Pregnancy The third trimester is from week 28 through week 40 (months 7 through 9). The third trimester is a time when the unborn baby (fetus) is growing rapidly. At the end of the ninth month, the fetus is about 20 inches in length and weighs 6-10 pounds. Body changes during your third trimester Your body will continue to go through many changes during pregnancy. The changes vary from woman to woman. During the third trimester:  Your weight will continue to increase. You can expect to gain 25-35 pounds (11-16 kg) by the end of the pregnancy.  You may begin to get stretch marks on your hips, abdomen, and breasts.  You may urinate more often because the fetus is moving lower into your pelvis and pressing on your bladder.  You may develop or continue to have heartburn. This is caused by increased hormones that slow down muscles in the digestive tract.  You may develop or continue to have constipation because increased hormones slow digestion and cause the muscles that push waste through your intestines to relax.  You may develop hemorrhoids. These are swollen veins (varicose veins) in the rectum that can itch or be painful.  You may develop swollen, bulging veins (varicose veins) in your legs.  You may have increased body aches in the pelvis, back, or thighs. This is due to weight gain and increased hormones that are relaxing your joints.  You may have changes in your hair. These can include thickening of your hair, rapid growth, and changes in texture. Some women also have hair loss during or after pregnancy, or hair that feels dry or thin. Your hair will most likely return to normal after your baby is born.  Your breasts will continue to grow and they will continue to become tender. A yellow fluid (colostrum) may leak from your breasts. This is the first milk you are producing for your baby.  Your belly button may stick out.  You may notice more swelling in your hands,  face, or ankles.  You may have increased tingling or numbness in your hands, arms, and legs. The skin on your belly may also feel numb.  You may feel short of breath because of your expanding uterus.  You may have more problems sleeping. This can be caused by the size of your belly, increased need to urinate, and an increase in your body's metabolism.  You may notice the fetus "dropping," or moving lower in your abdomen (lightening).  You may have increased vaginal discharge.  You may notice your joints feel loose and you may have pain around your pelvic bone.  What to expect at prenatal visits You will have prenatal exams every 2 weeks until week 36. Then you will have weekly prenatal exams. During a routine prenatal visit:  You will be weighed to make sure you and the baby are growing normally.  Your blood pressure will be taken.  Your abdomen will be measured to track your baby's growth.  The fetal heartbeat will be listened to.  Any test results from the previous visit will be discussed.  You may have a cervical check near your due date to see if your cervix has softened or thinned (effaced).  You will be tested for Group B streptococcus. This happens between 35 and 37 weeks.  Your health care provider may ask you:  What your birth plan is.  How you are feeling.  If you are feeling the baby move.  If you have had   any abnormal symptoms, such as leaking fluid, bleeding, severe headaches, or abdominal cramping.  If you are using any tobacco products, including cigarettes, chewing tobacco, and electronic cigarettes.  If you have any questions.  Other tests or screenings that may be performed during your third trimester include:  Blood tests that check for low iron levels (anemia).  Fetal testing to check the health, activity level, and growth of the fetus. Testing is done if you have certain medical conditions or if there are problems during the  pregnancy.  Nonstress test (NST). This test checks the health of your baby to make sure there are no signs of problems, such as the baby not getting enough oxygen. During this test, a belt is placed around your belly. The baby is made to move, and its heart rate is monitored during movement.  What is false labor? False labor is a condition in which you feel small, irregular tightenings of the muscles in the womb (contractions) that usually go away with rest, changing position, or drinking water. These are called Braxton Hicks contractions. Contractions may last for hours, days, or even weeks before true labor sets in. If contractions come at regular intervals, become more frequent, increase in intensity, or become painful, you should see your health care provider. What are the signs of labor?  Abdominal cramps.  Regular contractions that start at 10 minutes apart and become stronger and more frequent with time.  Contractions that start on the top of the uterus and spread down to the lower abdomen and back.  Increased pelvic pressure and dull back pain.  A watery or bloody mucus discharge that comes from the vagina.  Leaking of amniotic fluid. This is also known as your "water breaking." It could be a slow trickle or a gush. Let your health care provider know if it has a color or strange odor. If you have any of these signs, call your health care provider right away, even if it is before your due date. Follow these instructions at home: Medicines  Follow your health care provider's instructions regarding medicine use. Specific medicines may be either safe or unsafe to take during pregnancy.  Take a prenatal vitamin that contains at least 600 micrograms (mcg) of folic acid.  If you develop constipation, try taking a stool softener if your health care provider approves. Eating and drinking  Eat a balanced diet that includes fresh fruits and vegetables, whole grains, good sources of protein  such as meat, eggs, or tofu, and low-fat dairy. Your health care provider will help you determine the amount of weight gain that is right for you.  Avoid raw meat and uncooked cheese. These carry germs that can cause birth defects in the baby.  If you have low calcium intake from food, talk to your health care provider about whether you should take a daily calcium supplement.  Eat four or five small meals rather than three large meals a day.  Limit foods that are high in fat and processed sugars, such as fried and sweet foods.  To prevent constipation: ? Drink enough fluid to keep your urine clear or pale yellow. ? Eat foods that are high in fiber, such as fresh fruits and vegetables, whole grains, and beans. Activity  Exercise only as directed by your health care provider. Most women can continue their usual exercise routine during pregnancy. Try to exercise for 30 minutes at least 5 days a week. Stop exercising if you experience uterine contractions.  Avoid heavy   lifting.  Do not exercise in extreme heat or humidity, or at high altitudes.  Wear low-heel, comfortable shoes.  Practice good posture.  You may continue to have sex unless your health care provider tells you otherwise. Relieving pain and discomfort  Take frequent breaks and rest with your legs elevated if you have leg cramps or low back pain.  Take warm sitz baths to soothe any pain or discomfort caused by hemorrhoids. Use hemorrhoid cream if your health care provider approves.  Wear a good support bra to prevent discomfort from breast tenderness.  If you develop varicose veins: ? Wear support pantyhose or compression stockings as told by your healthcare provider. ? Elevate your feet for 15 minutes, 3-4 times a day. Prenatal care  Write down your questions. Take them to your prenatal visits.  Keep all your prenatal visits as told by your health care provider. This is important. Safety  Wear your seat belt at  all times when driving.  Make a list of emergency phone numbers, including numbers for family, friends, the hospital, and police and fire departments. General instructions  Avoid cat litter boxes and soil used by cats. These carry germs that can cause birth defects in the baby. If you have a cat, ask someone to clean the litter box for you.  Do not travel far distances unless it is absolutely necessary and only with the approval of your health care provider.  Do not use hot tubs, steam rooms, or saunas.  Do not drink alcohol.  Do not use any products that contain nicotine or tobacco, such as cigarettes and e-cigarettes. If you need help quitting, ask your health care provider.  Do not use any medicinal herbs or unprescribed drugs. These chemicals affect the formation and growth of the baby.  Do not douche or use tampons or scented sanitary pads.  Do not cross your legs for long periods of time.  To prepare for the arrival of your baby: ? Take prenatal classes to understand, practice, and ask questions about labor and delivery. ? Make a trial run to the hospital. ? Visit the hospital and tour the maternity area. ? Arrange for maternity or paternity leave through employers. ? Arrange for family and friends to take care of pets while you are in the hospital. ? Purchase a rear-facing car seat and make sure you know how to install it in your car. ? Pack your hospital bag. ? Prepare the baby's nursery. Make sure to remove all pillows and stuffed animals from the baby's crib to prevent suffocation.  Visit your dentist if you have not gone during your pregnancy. Use a soft toothbrush to brush your teeth and be gentle when you floss. Contact a health care provider if:  You are unsure if you are in labor or if your water has broken.  You become dizzy.  You have mild pelvic cramps, pelvic pressure, or nagging pain in your abdominal area.  You have lower back pain.  You have persistent  nausea, vomiting, or diarrhea.  You have an unusual or bad smelling vaginal discharge.  You have pain when you urinate. Get help right away if:  Your water breaks before 37 weeks.  You have regular contractions less than 5 minutes apart before 37 weeks.  You have a fever.  You are leaking fluid from your vagina.  You have spotting or bleeding from your vagina.  You have severe abdominal pain or cramping.  You have rapid weight loss or weight gain.    You have shortness of breath with chest pain.  You notice sudden or extreme swelling of your face, hands, ankles, feet, or legs.  Your baby makes fewer than 10 movements in 2 hours.  You have severe headaches that do not go away when you take medicine.  You have vision changes. Summary  The third trimester is from week 28 through week 40, months 7 through 9. The third trimester is a time when the unborn baby (fetus) is growing rapidly.  During the third trimester, your discomfort may increase as you and your baby continue to gain weight. You may have abdominal, leg, and back pain, sleeping problems, and an increased need to urinate.  During the third trimester your breasts will keep growing and they will continue to become tender. A yellow fluid (colostrum) may leak from your breasts. This is the first milk you are producing for your baby.  False labor is a condition in which you feel small, irregular tightenings of the muscles in the womb (contractions) that eventually go away. These are called Braxton Hicks contractions. Contractions may last for hours, days, or even weeks before true labor sets in.  Signs of labor can include: abdominal cramps; regular contractions that start at 10 minutes apart and become stronger and more frequent with time; watery or bloody mucus discharge that comes from the vagina; increased pelvic pressure and dull back pain; and leaking of amniotic fluid. This information is not intended to replace advice  given to you by your health care provider. Make sure you discuss any questions you have with your health care provider. Document Released: 09/14/2001 Document Revised: 02/26/2016 Document Reviewed: 11/21/2012 Elsevier Interactive Patient Education  2017 [employer]  

## 2018-02-22 NOTE — Progress Notes (Signed)
Pt reports no problems.   

## 2018-03-02 ENCOUNTER — Telehealth: Payer: Self-pay

## 2018-03-02 NOTE — Telephone Encounter (Signed)
Pt calling triage c/o cramping and a lot of pain/pressure this AM. Tried to call # back and it seems to be disconnected/busy tone. Please let me know if she calls back. She made need appt at office, but need to get more info.

## 2018-03-06 ENCOUNTER — Inpatient Hospital Stay (HOSPITAL_COMMUNITY)
Admission: AD | Admit: 2018-03-06 | Discharge: 2018-03-06 | Disposition: A | Discharge status: Home / Self Care | Payer: Medicaid Other | Source: Ambulatory Visit | Attending: Obstetrics & Gynecology | Admitting: Obstetrics & Gynecology

## 2018-03-06 ENCOUNTER — Encounter (HOSPITAL_COMMUNITY): Payer: Self-pay | Admitting: *Deleted

## 2018-03-06 DIAGNOSIS — Z3A34 34 weeks gestation of pregnancy: Secondary | ICD-10-CM | POA: Diagnosis not present

## 2018-03-06 DIAGNOSIS — O4703 False labor before 37 completed weeks of gestation, third trimester: Secondary | ICD-10-CM | POA: Diagnosis not present

## 2018-03-06 DIAGNOSIS — Z3689 Encounter for other specified antenatal screening: Secondary | ICD-10-CM

## 2018-03-06 DIAGNOSIS — O368130 Decreased fetal movements, third trimester, not applicable or unspecified: Secondary | ICD-10-CM | POA: Diagnosis not present

## 2018-03-06 DIAGNOSIS — Z3A33 33 weeks gestation of pregnancy: Secondary | ICD-10-CM | POA: Diagnosis not present

## 2018-03-06 LAB — URINALYSIS, ROUTINE W REFLEX MICROSCOPIC
Bilirubin Urine: NEGATIVE
Glucose, UA: NEGATIVE mg/dL
Hgb urine dipstick: NEGATIVE
KETONES UR: NEGATIVE mg/dL
LEUKOCYTES UA: NEGATIVE
Nitrite: NEGATIVE
PH: 7 (ref 5.0–8.0)
Protein, ur: NEGATIVE mg/dL
SPECIFIC GRAVITY, URINE: 1.024 (ref 1.005–1.030)

## 2018-03-06 LAB — FETAL FIBRONECTIN: FETAL FIBRONECTIN: NEGATIVE

## 2018-03-06 MED ORDER — NIFEDIPINE 10 MG PO CAPS
10.0000 mg | ORAL_CAPSULE | ORAL | Status: AC | PRN
  Administered 2018-03-06 (×3): 10 mg via ORAL
  Filled 2018-03-06 (×3): qty 1

## 2018-03-06 NOTE — MAU Provider Note (Signed)
History     CSN: [id number]  Arrival date and time: 03/06/18 1221   First Provider Initiated Contact with Patient 03/06/18 1316      Chief Complaint  Patient presents with  . Decreased Fetal Movement  . Abdominal Pain   HPI  Meagan Carpenter is a 22 y.o. [phone] at 3361w6d who presents with abdominal pain and decreased fetal movement. Reports feeling no fetal movement in the last 24 hours. Tried to call her ob [facility] but never heard back. Also reports constant lower abdominal pain & intermittent tightening since Saturday. Rates pain 8/10. Denies LOF, vaginal bleeding, or recent intercourse.   OB History    Gravida  4   Para  2   Term  2   Preterm  0   AB  1   Living  2     SAB  1   TAB  0   Ectopic  0   Multiple  0   Live Births  2           Past Medical History:  Diagnosis Date  . Chlamydia   . Headache(784.0)   . Pre-eclampsia   . UTI (lower urinary tract infection)     Past Surgical History:  Procedure Laterality Date  . ELBOW FRACTURE SURGERY  2012    Family History  Problem Relation Age of Onset  . Heart failure Paternal Grandmother        pacemaker  . Hyperlipidemia Paternal Grandmother   . Hypertension Paternal Grandmother   . Cancer Paternal Grandmother [phone]       great grandmother  . Migraines Paternal Grandmother   . Stroke Paternal Grandfather   . ADD / ADHD Father   . ADD / ADHD Brother   . Autism Other        paternal [place]Great Grandmother & Paternal Randie HeinzGreat Aunt had Autism    Social History   Tobacco Use  . Smoking status: Former Smoker    Types: Cigarettes    Last attempt to quit: 01/01/2010    Years since quitting: 8.1  . Smokeless tobacco: Never Used  Substance Use Topics  . Alcohol use: Not Currently    Alcohol/week: 0.0 oz  . Drug use: No    Allergies: No Known Allergies  Medications Prior to Admission  Medication Sig Dispense Refill Last Dose  . aspirin EC 81 MG tablet Take 1 tablet (81 mg total) by mouth  daily. Take after 12 weeks for prevention of preeclampssia later in pregnancy (Patient not taking: Reported on 01/18/2018) 300 tablet 2 Not Taking  . Butalbital-APAP-Caffeine 50-325-40 MG capsule Take 1-2 capsules by mouth every 6 (six) hours as needed for headache. (Patient not taking: Reported on 01/18/2018) 30 capsule 3 Not Taking  . ferrous sulfate (FERROUSUL) 325 (65 FE) MG tablet Take 1 tablet (325 mg total) by mouth daily with breakfast. 60 tablet 4 Taking  . ondansetron (ZOFRAN ODT) 4 MG disintegrating tablet Take 1 tablet (4 mg total) by mouth every 6 (six) hours as needed for nausea. (Patient not taking: Reported on 01/18/2018) 20 tablet 2 Not Taking  . Prenatal Multivit-Min-Fe-FA (PRENATAL VITAMINS) 0.8 MG tablet Take 1 tablet daily by mouth. (Patient not taking: Reported on 01/18/2018) 30 tablet 12 Not Taking    Review of Systems  Constitutional: Negative.   Gastrointestinal: Positive for abdominal pain. Negative for diarrhea, nausea and vomiting.  Genitourinary: Negative.    Physical Exam   Blood pressure 120/74, pulse 88, temperature 98.6 F (37 C),  temperature source Oral, resp. rate 16, height 5\' 5"  (1.651 m), weight 144 lb (65.3 kg), last menstrual period 07/12/2017.  Physical Exam  Nursing note and vitals reviewed. Constitutional: She is oriented to person, place, and time. She appears well-developed and well-nourished. No distress.  HENT:  Head: Normocephalic and atraumatic.  Eyes: Conjunctivae are normal. Right eye exhibits no discharge. Left eye exhibits no discharge. No scleral icterus.  Neck: Normal range of motion.  Respiratory: Effort normal. No respiratory distress.  GI: Soft. There is no tenderness.  Genitourinary:  Genitourinary Comments: Dilation: 1.5 Effacement (%): 40 Station: Ballotable Exam by:: Judeth Horn, NP   Neurological: She is alert and oriented to person, place, and time.  Skin: Skin is warm and dry. She is not diaphoretic.  Psychiatric: She  has a normal mood and affect. Her behavior is normal. Judgment and thought content normal.    MAU Course  Procedures Results for orders placed or performed during the hospital encounter of 03/06/18 (from the past 24 hour(s))  Urinalysis, Routine w reflex microscopic     Status: Abnormal   Collection Time: 03/06/18 12:37 PM  Result Value Ref Range   Color, Urine YELLOW YELLOW   APPearance HAZY (A) CLEAR   Specific Gravity, Urine 1.024 1.005 - 1.030   pH 7.0 5.0 - 8.0   Glucose, UA NEGATIVE NEGATIVE mg/dL   Hgb urine dipstick NEGATIVE NEGATIVE   Bilirubin Urine NEGATIVE NEGATIVE   Ketones, ur NEGATIVE NEGATIVE mg/dL   Protein, ur NEGATIVE NEGATIVE mg/dL   Nitrite NEGATIVE NEGATIVE   Leukocytes, UA NEGATIVE NEGATIVE  Fetal fibronectin     Status: None   Collection Time: 03/06/18  1:25 PM  Result Value Ref Range   Fetal Fibronectin NEGATIVE NEGATIVE    MDM NST:  Baseline: 135 bpm, Variability: Good {> 6 bpm), Accelerations: Reactive and Decelerations: Absent  Reactive NST. Patient reports feeling baby move like normal since being placed on monitor.   SVE 1.5/40/ballotable. Ctx every 3-5 minutes.  FFN negative PO hydration and procardia given SVE unchanged after 2 hrs of monitoring Assessment and Plan  A: 1. Preterm uterine contractions in third trimester, antepartum   2. Decreased fetal movements in third trimester, single or unspecified fetus   3. NST (non-stress test) reactive    P: Discharge home F/u with OB Discussed reasons to return to MAU vs local hospital where her ob has privileges PTL precautions    Judeth Horn 03/06/2018, 1:16 PM

## 2018-03-06 NOTE — MAU Note (Signed)
Pt C/O lower abd & pelvic pressure, vaginal discharge, [facility]  Hx of preelampsia with previous pregnancies.  Has felt no fetal movement in the last 24 hours.  C/O mild uc's, denies bleeding or LOF.

## 2018-03-06 NOTE — Discharge Instructions (Signed)

## 2018-03-10 ENCOUNTER — Ambulatory Visit (INDEPENDENT_AMBULATORY_CARE_PROVIDER_SITE_OTHER): Payer: Medicaid Other | Admitting: Maternal Newborn

## 2018-03-10 VITALS — BP 120/80 | Wt 148.0 lb

## 2018-03-10 DIAGNOSIS — O0993 Supervision of high risk pregnancy, unspecified, third trimester: Secondary | ICD-10-CM

## 2018-03-10 DIAGNOSIS — Z3A34 34 weeks gestation of pregnancy: Secondary | ICD-10-CM

## 2018-03-10 DIAGNOSIS — O0991 Supervision of high risk pregnancy, unspecified, first trimester: Secondary | ICD-10-CM

## 2018-03-10 DIAGNOSIS — O4703 False labor before 37 completed weeks of gestation, third trimester: Secondary | ICD-10-CM

## 2018-03-10 NOTE — Progress Notes (Signed)
C/o feet swelling. rj

## 2018-03-10 NOTE — Progress Notes (Signed)
Routine Prenatal Care Visit  Subjective  Meagan Carpenter is a 22 y.o. [phone] at 6128w3d being seen today for ongoing prenatal care.  She is currently monitored for the following issues for this high-risk pregnancy and has Tension headache; Migraine headache without aura; Supervision of high risk pregnancy, antepartum, first trimester; Hx of preeclampsia, prior pregnancy, currently pregnant, first trimester; Nausea and vomiting during pregnancy prior to [redacted] weeks gestation; Anemia affecting pregnancy in third trimester; Preterm uterine contractions in third trimester, antepartum; and Preterm labor in third trimester on their problem list.  ----------------------------------------------------------------------------------- Patient reports pedal edema and contractions. [facility] and her cervix was 1.5 cm at that time. Contractions: Irregular. Vag. Bleeding: None.  Movement: Present. No leaking of fluid.  ----------------------------------------------------------------------------------- The following portions of the patient's history were reviewed and updated as appropriate: allergies, current medications, past family history, past medical history, past social history, past surgical history and problem list. Problem list updated.   Objective  Blood pressure 120/80, weight 148 lb (67.1 kg), last menstrual period 07/12/2017. Pregravid weight 110 lb (49.9 kg) Total Weight Gain 38 lb (17.2 kg) Urinalysis: Urine Protein: Negative Urine Glucose: Negative  Fetal Status: Fetal Heart Rate (bpm): 148 Fundal Height: 33 cm Movement: Present     General:  Alert, oriented and cooperative. Patient is in no acute distress.  Skin: Skin is warm and dry. No rash noted.   Cardiovascular: Normal heart rate noted  Respiratory: Normal respiratory effort, no problems with respiration noted  Abdomen: Soft, gravid, appropriate for gestational age.  Pain/Pressure: Absent     Pelvic:  Cervical exam deferred        Extremities: Normal range of motion.     Mental Status: Normal mood and affect. Normal behavior. Normal judgment and thought content.     Assessment   21 y.o. [id number] at 4628w3d, EDD  04/18/2018 by Last Menstrual Period presenting for routine prenatal visit.  Plan   pregnancy#4 Problems (from 07/12/17 to present)    Problem Noted Resolved   Anemia affecting pregnancy in third trimester 01/24/2018 by Oswaldo ConroySchmid, Jacelyn Y, CNM No   Supervision of high risk pregnancy, antepartum, first trimester 08/22/2017 by Natale MilchSchuman, Christanna R, MD No   Overview Addendum 09/30/2017  4:48 PM by Oswaldo ConroySchmid, Jacelyn Y, CNM    Clinic [facility] Prenatal Labs  Dating L=7 Blood type: O Positive  Genetic Screen NIPS: Negative, it's a girl! Antibody: Negative  Anatomic US  Rubella: Immune  Varicella: Immune  GTT Early:               Third trimester:  RPR: NR  Rhogam  HBsAg: Negative  TDaP vaccine                       Flu Shot: HIV: NR  Baby Food                                GBS:   Contraception  Pap: 08/22/2017 NILM/HPV Negative  CBB     CS/VBAC    Support Person               Discussed coming [facility] for future signs of preterm labor and explained [facility].  Preterm labor symptoms and general obstetric precautions including but not limited to vaginal bleeding, contractions, leaking  of fluid and fetal movement were reviewed.  Return in about 2 weeks (around 03/24/2018) for ROB.  Marcelyn Bruins, CNM 03/10/2018  3:09 PM

## 2018-03-11 ENCOUNTER — Other Ambulatory Visit: Payer: Self-pay

## 2018-03-11 ENCOUNTER — Inpatient Hospital Stay
Admission: EM | Admit: 2018-03-11 | Discharge: 2018-03-13 | DRG: 833 | Disposition: A | Discharge status: Home / Self Care | Payer: Medicaid Other | Attending: Obstetrics and Gynecology | Admitting: Obstetrics and Gynecology

## 2018-03-11 DIAGNOSIS — Z8349 Family history of other endocrine, nutritional and metabolic diseases: Secondary | ICD-10-CM | POA: Diagnosis not present

## 2018-03-11 DIAGNOSIS — O4703 False labor before 37 completed weeks of gestation, third trimester: Secondary | ICD-10-CM | POA: Diagnosis present

## 2018-03-11 DIAGNOSIS — Z8249 Family history of ischemic heart disease and other diseases of the circulatory system: Secondary | ICD-10-CM

## 2018-03-11 DIAGNOSIS — Z823 Family history of stroke: Secondary | ICD-10-CM

## 2018-03-11 DIAGNOSIS — Z3A34 34 weeks gestation of pregnancy: Secondary | ICD-10-CM | POA: Diagnosis not present

## 2018-03-11 LAB — URINALYSIS, COMPLETE (UACMP) WITH MICROSCOPIC
BACTERIA UA: NONE SEEN
Bilirubin Urine: NEGATIVE
Glucose, UA: NEGATIVE mg/dL
Hgb urine dipstick: NEGATIVE
Ketones, ur: NEGATIVE mg/dL
Leukocytes, UA: NEGATIVE
Nitrite: NEGATIVE
PH: 7 (ref 5.0–8.0)
Protein, ur: NEGATIVE mg/dL
SPECIFIC GRAVITY, URINE: 1.009 (ref 1.005–1.030)
SQUAMOUS EPITHELIAL / LPF: NONE SEEN (ref 0–5)

## 2018-03-11 LAB — WET PREP, GENITAL
Clue Cells Wet Prep HPF POC: NONE SEEN
SPERM: NONE SEEN
TRICH WET PREP: NONE SEEN
YEAST WET PREP: NONE SEEN

## 2018-03-11 LAB — OB RESULTS CONSOLE GC/CHLAMYDIA
Chlamydia: NEGATIVE
GC PROBE AMP, GENITAL: NEGATIVE

## 2018-03-11 LAB — CHLAMYDIA/NGC RT PCR (ARMC ONLY)
Chlamydia Tr: NOT DETECTED
N gonorrhoeae: NOT DETECTED

## 2018-03-11 LAB — OB RESULTS CONSOLE GBS: STREP GROUP B AG: NEGATIVE

## 2018-03-11 MED ORDER — TERBUTALINE SULFATE 1 MG/ML IJ SOLN
INTRAMUSCULAR | Status: AC
  Administered 2018-03-12: 0.25 mg via SUBCUTANEOUS
  Filled 2018-03-11: qty 1

## 2018-03-11 MED ORDER — OXYTOCIN BOLUS FROM INFUSION: 500.0000 mL | Freq: Once | INTRAVENOUS | Status: DC

## 2018-03-11 MED ORDER — BETAMETHASONE SOD PHOS & ACET 6 (3-3) MG/ML IJ SUSP
INTRAMUSCULAR | Status: AC
  Administered 2018-03-12: 12 mg via INTRAMUSCULAR
  Filled 2018-03-11: qty 1

## 2018-03-11 MED ORDER — LIDOCAINE HCL (PF) 1 % IJ SOLN: 30.0000 mL | INTRAMUSCULAR | Status: DC | PRN

## 2018-03-11 MED ORDER — SODIUM CHLORIDE 0.9 % IV SOLN
5.0000 10*6.[IU] | Freq: Once | INTRAVENOUS | Status: AC
  Administered 2018-03-12: 5 10*6.[IU] via INTRAVENOUS
  Filled 2018-03-11: qty 5

## 2018-03-11 MED ORDER — PENICILLIN G POT IN DEXTROSE 60000 UNIT/ML IV SOLN
3.0000 10*6.[IU] | INTRAVENOUS | Status: DC
  Administered 2018-03-12: 3 10*6.[IU] via INTRAVENOUS
  Filled 2018-03-11 (×9): qty 50

## 2018-03-11 MED ORDER — LACTATED RINGERS IV SOLN: 500.0000 mL | INTRAVENOUS | Status: DC | PRN

## 2018-03-11 MED ORDER — BETAMETHASONE SOD PHOS & ACET 6 (3-3) MG/ML IJ SUSP
12.0000 mg | INTRAMUSCULAR | Status: AC
  Administered 2018-03-12 – 2018-03-13 (×2): 12 mg via INTRAMUSCULAR

## 2018-03-11 MED ORDER — OXYTOCIN 10 UNIT/ML IJ SOLN: 10.0000 [IU] | Freq: Once | INTRAMUSCULAR | Status: DC

## 2018-03-11 MED ORDER — ONDANSETRON HCL 4 MG/2ML IJ SOLN: 4.0000 mg | Freq: Four times a day (QID) | INTRAMUSCULAR | Status: DC | PRN

## 2018-03-11 MED ORDER — SOD CITRATE-CITRIC ACID 500-334 MG/5ML PO SOLN
30.0000 mL | ORAL | Status: DC | PRN
  Administered 2018-03-12: 30 mL via ORAL
  Filled 2018-03-11 (×2): qty 15

## 2018-03-11 MED ORDER — LACTATED RINGERS IV SOLN
INTRAVENOUS | Status: DC
  Administered 2018-03-12: 01:00:00 via INTRAVENOUS

## 2018-03-11 MED ORDER — TERBUTALINE SULFATE 1 MG/ML IJ SOLN
0.2500 mg | INTRAMUSCULAR | Status: AC
  Administered 2018-03-12: 0.25 mg via SUBCUTANEOUS

## 2018-03-11 MED ORDER — OXYTOCIN 40 UNITS IN LACTATED RINGERS INFUSION - SIMPLE MED: 2.5000 [IU]/h | INTRAVENOUS | Status: DC

## 2018-03-11 NOTE — OB Triage Note (Signed)
Patient came in for observation for preterm labor evaluation. Patient reports uterine contractions every five minutes since 1930. Patient states the contractions started after she had sexual intercourse.  Patient reports clear mucous discharge and vaginal spotting this morning. Patient reports decreased FM. Vital signs stable and patient afebrile. FHR baseline 140 with moderate variability with accelerations 15 x 15 and no decelerations. Significant other at bedside. Will continue to monitor.

## 2018-03-11 NOTE — H&P (Signed)
OB History & Physical   History of Present Illness:  Chief Complaint: contraction  HPI:  APOORVA BUGAY is a 22 y.o. [phone] female at [redacted]w[redacted]d dated by LMP consistent with 7 week ultrasound.  Her pregnancy has been uncomplicated.    She reports contractions.   She denies leakage of fluid.   She denies vaginal bleeding.   She reports fetal movement.  She has been having gradually more painful contractions throughout the day.  She was seen on 6/4 at [facility] and was dilated to 1.5 cm.  She was given three doses of nifedipine and was stable over a 2 hour check. She was discharged.  She was seen yesterday in clinic, but according to the patient no cervical exam was performed.   Of note, she reports preterm delivery of her second child at 36 weeks. However, records indicate she was 105w2d, as she delivered at [facility].   Maternal Medical History:   Past Medical History:  Diagnosis Date  . Chlamydia   . Headache(784.0)   . Pre-eclampsia   . UTI (lower urinary tract infection)     Past Surgical History:  Procedure Laterality Date  . ELBOW FRACTURE SURGERY  2012    No Known Allergies  Prior to Admission medications   Medication Sig Start Date End Date Taking? Authorizing Provider  calcium carbonate (TUMS - DOSED IN MG ELEMENTAL CALCIUM) 500 MG chewable tablet Chew 1 tablet by mouth 3 (three) times daily as needed for indigestion or heartburn.   Yes [provider]  flintstones complete (FLINTSTONES) 60 MG chewable tablet Chew 2 tablets by mouth daily.   Yes [provider]  omeprazole (PRILOSEC) 20 MG capsule Take 20 mg by mouth daily as needed (For heatrburn.).   Yes [provider]    OB History  Gravida Para Term Preterm AB Living  4 2 2  0 1 2  SAB TAB Ectopic Multiple Live Births  1 0 0 0 2    # Outcome Date GA Lbr Len/2nd Weight Sex Delivery Anes PTL Lv  4 Current           3 Term 11/05/15 [redacted]w[redacted]d 05:30 / 01:47 6 lb 8.8 oz (2.97 kg) F  Vag-Spont EPI  LIV  2 Term 08/03/14 [redacted]w[redacted]d 03:51 / 02:57 7 lb 3 oz (3.26 kg) M Vag-Vacuum EPI  LIV  1 SAB 2014            Prenatal care site: [facility]  Social History: She  reports that she quit smoking about 8 years ago. Her smoking use included cigarettes. She has never used smokeless tobacco. She reports that she drank alcohol. She reports that she does not use drugs.  Family History: family history includes ADD / ADHD in her brother and father; Autism in her other; Cancer (age of onset: 35) in her paternal grandmother; Heart failure in her paternal grandmother; Hyperlipidemia in her paternal grandmother; Hypertension in her paternal grandmother; Migraines in her paternal grandmother; Stroke in her paternal grandfather.   Review of Systems: Negative x 10 systems reviewed except as noted in the HPI.    Physical Exam:  Vital Signs: BP 129/80 (BP Location: Left Arm)   Pulse 94   Temp 97.8 F (36.6 C) (Oral)   Resp 19   Ht 5\' 4"  (1.626 m)   Wt 190 lb (86.2 kg)   LMP 07/12/2017 (Exact Date)   BMI 32.61 kg/m  Constitutional: Well nourished, well developed female in no acute distress.  HEENT:  normal Skin: Warm and dry.  Cardiovascular: Regular rate and rhythm.   Extremity: no edema  Respiratory: Clear to auscultation bilateral. Normal respiratory effort Abdomen: FHT present and gravid, NT Back: no CVAT Neuro: DTRs 2+, Cranial nerves grossly intact Psych: Alert and Oriented x3. No memory deficits. Normal mood and affect.  MS: normal gait, normal bilateral lower extremity ROM/strength/stability.  Pelvic exam: (female chaperone present) is not limited by body habitus EGBUS: within normal limits Vagina: within normal limits and with normal mucosa blood in the vault Cervix: 3 cm -> changed to 4 cm after 2 hours   Pertinent Results:  Prenatal Labs: Blood type/Rh O positive  Antibody screen negative  Rubella Immune  Varicella Immune    RPR NR  HBsAg negative  HIV  negative  GC negative  Chlamydia negative  Genetic screening NIPT diploid XX, msAFP not done  1 hour GTT 95  3 hour GTT n/a  GBS unknown   Lab Results  Component Value Date   TRICHWETPREP NONE SEEN 03/11/2018   CLUECELLS NONE SEEN 03/11/2018   WBCWETPREP MODERATE (A) 03/11/2018   YEASTWETPREP NONE SEEN 03/11/2018    Lab Results  Component Value Date   CHLAMYDIA NOT DETECTED 03/11/2018   NGONORRHOEAE NOT DETECTED 03/11/2018     Lab Results  Component Value Date   APPEARANCEUR CLEAR (A) 03/11/2018   GLUCOSEU NEGATIVE 03/11/2018   BILIRUBINUR NEGATIVE 03/11/2018   KETONESUR NEGATIVE 03/11/2018   LABSPEC 1.009 03/11/2018   HGBUR NEGATIVE 03/11/2018   PHURINE 7.0 03/11/2018   NITRITE NEGATIVE 03/11/2018   LEUKOCYTESUR NEGATIVE 03/11/2018   RBCU 0-5 11/05/2015   WBCU 0-5 03/11/2018   BACTERIA NONE SEEN 03/11/2018   EPIU NONE SEEN 03/11/2018    Baseline FHR: 140 beats/min   Variability: moderate   Accelerations: present   Decelerations: absent Contractions: present frequency: 4-5 q 10 min Overall assessment: cat 1  Bedside Ultrasound:  Number of Fetus: 1  Presentation: cephalic  Fluid: subjectively normal  Placental Location: posterior  EFW: 2,493 grams (5 lb 8 oz +/- 13 oz)  Assessment:  Townsend Rogerlexis R Anthis is a 22 y.o. [id number] female at 7636w4d with preterm labor.   Plan:  1. Admit to Labor & Delivery  2. CBC, T&S, Clrs, IVF 3. GBS unknown: PCN per CDC protocol.   4. Fetwal well-being: reassuring 5. Preterm labor: will give one dose of terbutaline.  Will also administer betamethasone given her early gestational age and likelihood of delivery in the next 7 days. Will not augment. GBS collected today and is pending at this time. Will notify NICU.   Thomasene MohairStephen Donnetta Gillin, MD 03/11/2018 11:51 PM

## 2018-03-12 ENCOUNTER — Encounter: Payer: Self-pay | Admitting: Maternal Newborn

## 2018-03-12 DIAGNOSIS — Z3A34 34 weeks gestation of pregnancy: Secondary | ICD-10-CM

## 2018-03-12 LAB — CBC
HEMATOCRIT: 31 % — AB (ref 35.0–47.0)
HEMOGLOBIN: 10 g/dL — AB (ref 12.0–16.0)
MCH: 26.1 pg (ref 26.0–34.0)
MCHC: 32.4 g/dL (ref 32.0–36.0)
MCV: 80.6 fL (ref 80.0–100.0)
Platelets: 242 10*3/uL (ref 150–440)
RBC: 3.84 MIL/uL (ref 3.80–5.20)
RDW: 15.3 % — AB (ref 11.5–14.5)
WBC: 9.7 10*3/uL (ref 3.6–11.0)

## 2018-03-12 LAB — TYPE AND SCREEN
ABO/RH(D): O POS
Antibody Screen: NEGATIVE

## 2018-03-12 MED ORDER — SODIUM CHLORIDE FLUSH 0.9 % IV SOLN
INTRAVENOUS | Status: AC
  Filled 2018-03-12: qty 10

## 2018-03-12 NOTE — Progress Notes (Signed)
Patient ID: Meagan Carpenter, female   DOB: 01/04/1996, 22 y.o.   MRN: [id number]  Labor Check  Subj:  Complaints: more comfortable, but feels pressure   Obj:  BP 123/71 (BP Location: Right Arm)   Pulse 88   Temp 98.1 F (36.7 C) (Oral)   Resp 19   Ht 5\' 4"  (1.626 m)   Wt 190 lb (86.2 kg)   LMP 07/12/2017 (Exact Date)   BMI 32.61 kg/m     Cervix: Dilation: 4 / Effacement (%): 50 / Station: -3  Baseline FHR: 130 beats/min   Variability: moderate   Accelerations: present   Decelerations: absent Contractions: present frequency: infrequent Overall assessment: category 1  A/P: 22 y.o. [id number] female at 6434w5d with preterm labor.  1.  Labor: expectant management  2.  FWB: reassuring, Overall assessment: category 1  3.  GBS unknown: PCN.  Discontinue for now due to no progress since last nigh  4.  Pain: prn, not requiring anything currently 5.  Recheck: prn 6.  Preterm Labor: next BMTZ due at 1211 tomorrow morning.   Thomasene MohairStephen Kaelynn Igo, MD, Merlinda FrederickFACOG [facility], [facility] 03/12/2018 10:00 AM

## 2018-03-13 DIAGNOSIS — Z3A34 34 weeks gestation of pregnancy: Secondary | ICD-10-CM

## 2018-03-13 LAB — RPR: RPR: NONREACTIVE

## 2018-03-13 LAB — CULTURE, BETA STREP (GROUP B ONLY)

## 2018-03-13 NOTE — Discharge Summary (Signed)
Discharge Summary   Patient ID: Meagan Carpenter [id number] 22 y.o. [birth year]  Admit date: 03/11/2018  Discharge date: 03/13/2018  Principal Diagnoses:  1) intrauterine pregnancy at [redacted]w[redacted]d  2) preterm labor in third trimester  Secondary Diagnoses:  1) intrauterine pregnancy at [redacted]w[redacted]d  2) preterm labor in third trimester  Procedures performed during the hospitalization:  1) monitoring for continue labor progression 2) administration of penicillin for GBS unknown status in labor 3) administration of betamethasone   HPI: Meagan Carpenter is a 22 y.o. [phone] female at [redacted]w[redacted]d dated by LMP consistent with 7 week ultrasound.  Her pregnancy has been complicated by preterm labor 4 days prior to presentation with dilation to 1.5 cm.    She reports contractions.   She denies leakage of fluid.   She denies vaginal bleeding.   She reports fetal movement.  She has been having gradually more painful contractions throughout the day.  She was seen on 6/4 at [facility] and was dilated to 1.5 cm.  She was given three doses of nifedipine and was stable over a 2 hour check. She was discharged.  She was seen yesterday in clinic, but according to the patient no cervical exam was performed.   Of note, she reports preterm delivery of her second child at 36 weeks. However, records indicate she was [redacted]w[redacted]d, as she delivered at [facility].   Past Medical History:  Diagnosis Date  . Chlamydia   . Headache(784.0)   . Pre-eclampsia   . UTI (lower urinary tract infection)     Past Surgical History:  Procedure Laterality Date  . ELBOW FRACTURE SURGERY  2012   Allergies: No Known Allergies  Social History   Tobacco Use  . Smoking status: Former Smoker    Types: Cigarettes    Last attempt to quit: 01/01/2010    Years since quitting: 8.2  . Smokeless tobacco: Never Used  Substance Use Topics  . Alcohol use: Not Currently    Alcohol/week: 0.0 oz  . Drug use: No    Family History   Problem Relation Age of Onset  . Heart failure Paternal Grandmother        pacemaker  . Hyperlipidemia Paternal Grandmother   . Hypertension Paternal Grandmother   . Cancer Paternal Grandmother 105       great grandmother  . Migraines Paternal Grandmother   . Stroke Paternal Grandfather   . ADD / ADHD Father   . ADD / ADHD Brother   . Autism Other        paternal [place] Grandmother & Paternal Randie Heinz Aunt had Autism    Hospital Course:  She was admitted after making cervical change from 3 to 4 cm in the setting of painful contractions every 3 minutes.  She was started on penicillin for GBS unknown status and was administered betamethasone on admission and 24 hours later.  She had reassuring fetal monitoring and vital signs. Even though her contractions continued, they abated quite a bit and her cervix remained unchanged at about 4 cm over a 24 hour period.  At the time of discharge, she was resting comfortably and she noted a significant reduction in contraction frequency and intensity.  She was discharged in stable condition with instructions to follow up this week. She was given precautions to return to the hospital should her contractions return and be painful.  Discharge Exam: BP 113/68 (BP Location: Right Arm)   Pulse 95   Temp 98.2 F (36.8 C) (Oral)   Resp  19   Ht 5\' 4"  (1.626 m)   Wt 190 lb (86.2 kg)   LMP 07/12/2017 (Exact Date)   BMI 32.61 kg/m  General  no apparent distress   CV  RRR   Pulmonary  clear to ausculatation bllaterally   Abdomen  Gravid, NT  Extremities  no edema, symmetric, SCDs in place    Condition at Discharge: Stable  Complications affecting treatment: None  Discharge Medications:  Allergies as of 03/13/2018   No Known Allergies     Medication List    STOP taking these medications   Butalbital-APAP-Caffeine 50-325-40 MG capsule   ondansetron 4 MG disintegrating tablet Commonly known as:  ZOFRAN ODT     TAKE these medications   aspirin EC  81 MG tablet Take 1 tablet (81 mg total) by mouth daily. Take after 12 weeks for prevention of preeclampssia later in pregnancy   calcium carbonate 500 MG chewable tablet Commonly known as:  TUMS - dosed in mg elemental calcium Chew 1 tablet by mouth 3 (three) times daily as needed for indigestion or heartburn.   ferrous sulfate 325 (65 FE) MG tablet Commonly known as:  FERROUSUL Take 1 tablet (325 mg total) by mouth daily with breakfast.   flintstones complete 60 MG chewable tablet Chew 2 tablets by mouth daily.   omeprazole 20 MG capsule Commonly known as:  PRILOSEC Take 20 mg by mouth daily as needed (For heatrburn.).      Follow-up arrangements:  Follow-up Information    [facility] Call in 3 day(s).   Specialty:  Obstetrics and Gynecology Why:  For follow up for preterm labor Contact information: [address] [place] [phone]         Discharge Disposition: Discharged to home.   Signed: Thomasene MohairStephen Juliah Scadden, MD 03/13/2018 1:06 AM

## 2018-03-15 ENCOUNTER — Encounter: Payer: Self-pay | Admitting: Obstetrics and Gynecology

## 2018-03-15 ENCOUNTER — Ambulatory Visit (INDEPENDENT_AMBULATORY_CARE_PROVIDER_SITE_OTHER): Payer: Medicaid Other | Admitting: Obstetrics and Gynecology

## 2018-03-15 VITALS — BP 110/68 | Wt 148.0 lb

## 2018-03-15 DIAGNOSIS — Z3A35 35 weeks gestation of pregnancy: Secondary | ICD-10-CM

## 2018-03-15 DIAGNOSIS — O09293 Supervision of pregnancy with other poor reproductive or obstetric history, third trimester: Secondary | ICD-10-CM

## 2018-03-15 DIAGNOSIS — O0991 Supervision of high risk pregnancy, unspecified, first trimester: Secondary | ICD-10-CM

## 2018-03-15 NOTE — Progress Notes (Incomplete)
Routine Prenatal Care Visit  Subjective  Meagan Carpenter is a 22 y.o. [phone] at 7164w1d being seen today for ongoing prenatal care.  She is currently monitored for the following issues for this {Blank single:19197::"high-risk","low-risk"} pregnancy and has Tension headache; Migraine headache without aura; Supervision of high risk pregnancy, antepartum, first trimester; Hx of preeclampsia, prior pregnancy, currently pregnant, third trimester; Nausea and vomiting during pregnancy prior to [redacted] weeks gestation; Anemia affecting pregnancy in third trimester; Preterm uterine contractions in third trimester, antepartum; and Preterm labor in third trimester on their problem list.  ----------------------------------------------------------------------------------- Patient reports {sx:14538}.   Contractions: Irregular. Vag. Bleeding: None.  Movement: Present. Denies leaking of fluid.  ----------------------------------------------------------------------------------- The following portions of the patient's history were reviewed and updated as appropriate: allergies, current medications, past family history, past medical history, past social history, past surgical history and problem list. Problem list updated.   Objective  Blood pressure 110/68, weight 148 lb (67.1 kg), last menstrual period 07/12/2017. Pregravid weight 110 lb (49.9 kg) Total Weight Gain 38 lb (17.2 kg) Urinalysis: Urine Protein: 1+ Urine Glucose: Negative  Fetal Status: Fetal Heart Rate (bpm): 135 Fundal Height: 35 cm Movement: Present  Presentation: Vertex  General:  Alert, oriented and cooperative. Patient is in no acute distress.  Skin: Skin is warm and dry. No rash noted.   Cardiovascular: Normal heart rate noted  Respiratory: Normal respiratory effort, no problems with respiration noted  Abdomen: Soft, gravid, appropriate for gestational age. Pain/Pressure: Present     Pelvic:  {Blank single:19197::"Cervical exam  performed","Cervical exam deferred"} Dilation: 4 Effacement (%): 50 Station: -3  Extremities: Normal range of motion.     Mental Status: Normal mood and affect. Normal behavior. Normal judgment and thought content.   Assessment   22 y.o. [id number] at 1764w1d by  04/18/2018, by Last Menstrual Period presenting for {Blank single:19197::"routine","work-in"} prenatal visit  Plan   pregnancy#4 Problems (from 07/12/17 to present)    Problem Noted Resolved   Preterm labor in third trimester 03/11/2018 by Conard NovakJackson, Dezarai Prew D, MD No   Anemia affecting pregnancy in third trimester 01/24/2018 by Oswaldo ConroySchmid, Jacelyn Y, CNM No   Supervision of high risk pregnancy, antepartum, first trimester 08/22/2017 by Natale MilchSchuman, Christanna R, MD No   Overview Addendum 09/30/2017  4:48 PM by Oswaldo ConroySchmid, Jacelyn Y, CNM    [facility] Prenatal [facility]  Dating L=7 Blood type: O Positive  Genetic Screen NIPS: Negative, it's a girl! Antibody: Negative  Anatomic US  Rubella: Immune  Varicella: Immune  GTT Early:               Third trimester:  RPR: NR  Rhogam  HBsAg: Negative  TDaP vaccine                       Flu Shot: HIV: NR  Baby Food                                GBS:   Contraception  Pap: 08/22/2017 NILM/HPV Negative  CBB     CS/VBAC    Support Person              Hx of preeclampsia, prior pregnancy, currently pregnant, third trimester 08/22/2017 by Natale MilchSchuman, Christanna R, MD No       {Blank single:19197::"Term","Preterm"} labor symptoms and general obstetric precautions including but not limited to vaginal bleeding, contractions, leaking of fluid and fetal movement were reviewed in detail  with the patient. Please refer to After Visit Summary for other counseling recommendations.   Return in about 1 week (around 03/22/2018) for Routine Prenatal Appointment.  [name], MD, [facility], [facility] 03/15/2018 4:12 PM  C/o migraine. Has not taken anything for it. Ctx last night 8p-2a

## 2018-03-15 NOTE — Progress Notes (Incomplete)
Routine Prenatal Care Visit  Subjective  Meagan Carpenter is a 22 y.o. [phone] at 7164w1d being seen today for ongoing prenatal care.  She is currently monitored for the following issues for this {Blank single:19197::"high-risk","low-risk"} pregnancy and has Tension headache; Migraine headache without aura; Supervision of high risk pregnancy, antepartum, first trimester; Hx of preeclampsia, prior pregnancy, currently pregnant, third trimester; Nausea and vomiting during pregnancy prior to [redacted] weeks gestation; Anemia affecting pregnancy in third trimester; Preterm uterine contractions in third trimester, antepartum; and Preterm labor in third trimester on their problem list.  ----------------------------------------------------------------------------------- Patient reports {sx:14538}.   Contractions: Irregular. Vag. Bleeding: None.  Movement: Present. Denies leaking of fluid.  ----------------------------------------------------------------------------------- The following portions of the patient's history were reviewed and updated as appropriate: allergies, current medications, past family history, past medical history, past social history, past surgical history and problem list. Problem list updated.   Objective  Blood pressure 110/68, weight 148 lb (67.1 kg), last menstrual period 07/12/2017. Pregravid weight 110 lb (49.9 kg) Total Weight Gain 38 lb (17.2 kg) Urinalysis: Urine Protein: 1+ Urine Glucose: Negative  Fetal Status: Fetal Heart Rate (bpm): 135 Fundal Height: 35 cm Movement: Present  Presentation: Vertex  General:  Alert, oriented and cooperative. Patient is in no acute distress.  Skin: Skin is warm and dry. No rash noted.   Cardiovascular: Normal heart rate noted  Respiratory: Normal respiratory effort, no problems with respiration noted  Abdomen: Soft, gravid, appropriate for gestational age. Pain/Pressure: Present     Pelvic:  {Blank single:19197::"Cervical exam  performed","Cervical exam deferred"} Dilation: 4 Effacement (%): 50 Station: -3  Extremities: Normal range of motion.     Mental Status: Normal mood and affect. Normal behavior. Normal judgment and thought content.   Assessment   22 y.o. [id number] at 1764w1d by  04/18/2018, by Last Menstrual Period presenting for {Blank single:19197::"routine","work-in"} prenatal visit  Plan   pregnancy#4 Problems (from 07/12/17 to present)    Problem Noted Resolved   Preterm labor in third trimester 03/11/2018 by Conard NovakJackson, Danyelle Brookover D, MD No   Anemia affecting pregnancy in third trimester 01/24/2018 by Oswaldo ConroySchmid, Jacelyn Y, CNM No   Supervision of high risk pregnancy, antepartum, first trimester 08/22/2017 by Natale MilchSchuman, Christanna R, MD No   Overview Addendum 09/30/2017  4:48 PM by Oswaldo ConroySchmid, Jacelyn Y, CNM    [facility] P[facility]atal [facility]  Dating L=7 Blood type: O Positive  Genetic Screen NIPS: Negative, it's a girl! Antibody: Negative  Anatomic US  Rubella: Immune  Varicella: Immune  GTT Early:               Third trimester:  RPR: NR  Rhogam  HBsAg: Negative  TDaP vaccine                       Flu Shot: HIV: NR  Baby Food                                GBS:   Contraception  Pap: 08/22/2017 NILM/HPV Negative  CBB     CS/VBAC    Support Person              Hx of preeclampsia, prior pregnancy, currently pregnant, third trimester 08/22/2017 by Natale MilchSchuman, Christanna R, MD No       {Blank single:19197::"Term","Preterm"} labor symptoms and general obstetric precautions including but not limited to vaginal bleeding, contractions, leaking of fluid and fetal movement were reviewed in detail  with the patient. Please refer to After Visit Summary for other counseling recommendations.   Return in about 1 week (around 03/22/2018) for Routine Prenatal Appointment.  Thomasene MohairStephen Ahnesti Townsend, MD, [facility]linda FrederickFACOG [facility], [facility] 03/15/2018 4:12 PM

## 2018-03-15 NOTE — Progress Notes (Signed)
Routine Prenatal Care Visit  Subjective  Meagan Carpenter is a 22 y.o. [phone] at 5844w1d being seen today for ongoing prenatal care.  She is currently monitored for the following issues for this high-risk pregnancy and has Tension headache; Migraine headache without aura; Supervision of high risk pregnancy, antepartum, first trimester; Hx of preeclampsia, prior pregnancy, currently pregnant, third trimester; Nausea and vomiting during pregnancy prior to [redacted] weeks gestation; Anemia affecting pregnancy in third trimester; Preterm uterine contractions in third trimester, antepartum; and Preterm labor in third trimester on their problem list.  ----------------------------------------------------------------------------------- Patient reports no complaints.   Contractions: Irregular. Vag. Bleeding: None.  Movement: Present. Denies leaking of fluid.  ----------------------------------------------------------------------------------- The following portions of the patient's history were reviewed and updated as appropriate: allergies, current medications, past family history, past medical history, past social history, past surgical history and problem list. Problem list updated.   Objective  Blood pressure 110/68, weight 148 lb (67.1 kg), last menstrual period 07/12/2017. Pregravid weight 110 lb (49.9 kg) Total Weight Gain 38 lb (17.2 kg) Urinalysis: Urine Protein: 1+ Urine Glucose: Negative  Fetal Status: Fetal Heart Rate (bpm): 135 Fundal Height: 35 cm Movement: Present  Presentation: Vertex  General:  Alert, oriented and cooperative. Patient is in no acute distress.  Skin: Skin is warm and dry. No rash noted.   Cardiovascular: Normal heart rate noted  Respiratory: Normal respiratory effort, no problems with respiration noted  Abdomen: Soft, gravid, appropriate for gestational age. Pain/Pressure: Present     Pelvic:  Cervical exam performed Dilation: 4 Effacement (%): 50 Station: -3  Extremities:  Normal range of motion.     Mental Status: Normal mood and affect. Normal behavior. Normal judgment and thought content.   Assessment   22 y.o. [id number] at 5744w1d by  04/18/2018, by Last Menstrual Period presenting for routine prenatal visit  Plan   pregnancy#4 Problems (from 07/12/17 to present)    Problem Noted Resolved   Preterm labor in third trimester 03/11/2018 by Conard NovakJackson, Asiya Cutbirth D, MD No   Anemia affecting pregnancy in third trimester 01/24/2018 by Oswaldo ConroySchmid, Jacelyn Y, CNM No   Supervision of high risk pregnancy, antepartum, first trimester 08/22/2017 by Natale MilchSchuman, Christanna R, MD No   Overview Addendum 09/30/2017  4:48 PM by Oswaldo ConroySchmid, Jacelyn Y, CNM    [facility]  Dating L=7 Blood type: O Positive  Genetic Screen NIPS: Negative, it's a girl! Antibody: Negative  Anatomic US  Rubella: Immune  Varicella: Immune  GTT Early:               Third trimester:  RPR: NR  Rhogam  HBsAg: Negative  TDaP vaccine                       Flu Shot: HIV: NR  Baby Food                                GBS:   Contraception  Pap: 08/22/2017 NILM/HPV Negative  CBB     CS/VBAC    Support Person              Hx of preeclampsia, prior pregnancy, currently pregnant, third trimester 08/22/2017 by Natale MilchSchuman, Christanna R, MD No       Preterm labor symptoms and general obstetric precautions including but not limited to vaginal bleeding, contractions, leaking of fluid and fetal movement were reviewed in detail with the patient. Please refer  to After Visit Summary for other counseling recommendations.   - wants BTL. Extensive counseling regarding alternative excellent forms of contraception. Patient adamant. Paperwork signed today.   Return in about 1 week (around 03/22/2018) for Routine Prenatal Appointment.  [name], MD, [facility], [facility] 03/15/2018 4:12 PM

## 2018-03-18 ENCOUNTER — Observation Stay
Admission: EM | Admit: 2018-03-18 | Discharge: 2018-03-19 | Disposition: A | Discharge status: Home / Self Care | Payer: Medicaid Other | Attending: Obstetrics & Gynecology | Admitting: Obstetrics & Gynecology

## 2018-03-18 ENCOUNTER — Other Ambulatory Visit: Payer: Self-pay

## 2018-03-18 DIAGNOSIS — Z3A35 35 weeks gestation of pregnancy: Secondary | ICD-10-CM | POA: Diagnosis not present

## 2018-03-18 DIAGNOSIS — O26893 Other specified pregnancy related conditions, third trimester: Secondary | ICD-10-CM | POA: Diagnosis present

## 2018-03-18 NOTE — OB Triage Note (Signed)
CC: Contractions  Patient came in at 2110 from home complaining of contractions that started at 1600. AT that point contractions were not intense, however, patient walked for a mile an d a half and contractions became stronger. Reports to sex in the last 24 hours. Reports contractions to be every 3 minutes apart. Rating pain a 8 out of 10. Patient reports positive fetal movement. Denies vaginal bleeding. Denies leakage of fluids. External fetal monitors applied. Call bell within reach. Bed in lowest position. Side rails up. Significant other at bedside.

## 2018-03-19 DIAGNOSIS — O26893 Other specified pregnancy related conditions, third trimester: Secondary | ICD-10-CM | POA: Diagnosis not present

## 2018-03-19 DIAGNOSIS — Z3A35 35 weeks gestation of pregnancy: Secondary | ICD-10-CM | POA: Diagnosis not present

## 2018-03-19 NOTE — Discharge Summary (Signed)
Physician Final Progress Note  Patient ID: Meagan Carpenter MRN: [id number] DOB/AGE: [birth year] 21 y.o.  Admit date: 03/18/2018 Admitting provider: Nadara Mustard, MD Discharge date: 03/19/2018   Admission Diagnoses: Contractions  Discharge Diagnoses:  Active Problems:   Indication for care in labor and delivery, antepartum 22 yo G4 P2012 at 35 weeks 5 days with reactive NST, irregular contractions, not in labor  History of Present Illness: The patient is a 22 y.o. female [phone] at [redacted]w[redacted]d who presents for contractions that began yesterday around 4 pm and became more intense and regular at every 3 minutes by 9 PM. She had intercourse in the 24 hours prior to the contractions and she walked for a mile and a half between 4 PM and 9 PM yesterday. She admits positive fetal movement. She denies leakage of fluid or vaginal bleeding. She was seen here 4 days ago and received betamethasone. Her cervix was 4 cm at that time and it has not changed since then. Discussion of labor precautions and encouraged patient regarding benefits of full term delivery.     Past Medical History:  Diagnosis Date  . Chlamydia   . Headache(784.0)   . Pre-eclampsia   . UTI (lower urinary tract infection)     Past Surgical History:  Procedure Laterality Date  . ELBOW FRACTURE SURGERY  2012    No current facility-administered medications on file prior to encounter.    Current Outpatient Medications on File Prior to Encounter  Medication Sig Dispense Refill  . calcium carbonate (TUMS - DOSED IN MG ELEMENTAL CALCIUM) 500 MG chewable tablet Chew 1 tablet by mouth 3 (three) times daily as needed for indigestion or heartburn.    . flintstones complete (FLINTSTONES) 60 MG chewable tablet Chew 2 tablets by mouth daily.    Marland Kitchen omeprazole (PRILOSEC) 20 MG capsule Take 20 mg by mouth daily as needed (For heatrburn.).    Marland Kitchen aspirin EC 81 MG tablet Take 1 tablet (81 mg total) by mouth daily. Take after 12 weeks for prevention  of preeclampssia later in pregnancy (Patient not taking: Reported on 01/18/2018) 300 tablet 2  . ferrous sulfate (FERROUSUL) 325 (65 FE) MG tablet Take 1 tablet (325 mg total) by mouth daily with breakfast. (Patient not taking: Reported on 03/06/2018) 60 tablet 4    No Known Allergies  Social History   Socioeconomic History  . Marital status: Single    Spouse name: Not on file  . Number of children: Not on file  . Years of education: Not on file  . Highest education level: Not on file  Occupational History  . Not on file  Social Needs  . Financial resource strain: Not on file  . Food insecurity:    Worry: Not on file    Inability: Not on file  . Transportation needs:    Medical: Not on file    Non-medical: Not on file  Tobacco Use  . Smoking status: Former Smoker    Types: Cigarettes    Last attempt to quit: 01/01/2010    Years since quitting: 8.2  . Smokeless tobacco: Never Used  Substance and Sexual Activity  . Alcohol use: Not Currently    Alcohol/week: 0.0 oz  . Drug use: No  . Sexual activity: Yes    Partners: Male    Birth control/protection: None  Lifestyle  . Physical activity:    Days per week: Not on file    Minutes per session: Not on file  . Stress: Not on  file  Relationships  . Social connections:    Talks on phone: Not on file    Gets together: Not on file    Attends religious service: Not on file    Active member of club or organization: Not on file    Attends meetings of clubs or organizations: Not on file    Relationship status: Not on file  . Intimate partner violence:    Fear of current or ex partner: Not on file    Emotionally abused: Not on file    Physically abused: Not on file    Forced sexual activity: Not on file  Other Topics Concern  . Not on file  Social History Narrative  . Not on file    Physical Exam: BP 118/76   Pulse 70   Temp 97.9 F (36.6 C) (Oral)   Resp 16   Ht 5\' 4"  (1.626 m)   Wt 148 lb (67.1 kg)   LMP 07/12/2017  (Exact Date)   BMI 25.40 kg/m   Gen: NAD CV: RRR Pulm: CTAB Pelvic: 4.5 cm/60/-3 posterior on admission and without change prior to discharge Toco: every 3 minutes on admission and spaced out to every 4-6 minutes on discharge Fetal well being: 125 bpm, moderate variability, +accelerations, -decelerations Ext: no evidence of DVT, no edema  Consults: None  Significant Findings/ Diagnostic Studies: none  Procedures: NST  Discharge Condition: good  Disposition: Discharge disposition: 01-Home or Self Care       Diet: Regular diet  Discharge Activity: Activity as tolerated  Discharge Instructions    Discharge activity:  No Restrictions   Complete by:  As directed    Discharge diet:  No restrictions   Complete by:  As directed    Fetal Kick Count:  Lie on our left side for one hour after a meal, and count the number of times your baby kicks.  If it is less than 5 times, get up, move around and drink some juice.  Repeat the test 30 minutes later.  If it is still less than 5 kicks in an hour, notify your doctor.   Complete by:  As directed    No sexual activity restrictions   Complete by:  As directed    Notify physician for a general feeling that "something is not right"   Complete by:  As directed    Notify physician for increase or change in vaginal discharge   Complete by:  As directed    Notify physician for intestinal cramps, with or without diarrhea, sometimes described as "gas pain"   Complete by:  As directed    Notify physician for leaking of fluid   Complete by:  As directed    Notify physician for low, dull backache, unrelieved by heat or Tylenol   Complete by:  As directed    Notify physician for menstrual like cramps   Complete by:  As directed    Notify physician for pelvic pressure   Complete by:  As directed    Notify physician for uterine contractions.  These may be painless and feel like the uterus is tightening or the baby is  "balling up"   Complete  by:  As directed    Notify physician for vaginal bleeding   Complete by:  As directed    PRETERM LABOR:  Includes any of the follwing symptoms that occur between 20 - [redacted] weeks gestation.  If these symptoms are not stopped, preterm labor can result in preterm delivery, placing  your baby at risk   Complete by:  As directed      Allergies as of 03/19/2018   No Known Allergies     Medication List    STOP taking these medications   aspirin EC 81 MG tablet   ferrous sulfate 325 (65 FE) MG tablet Commonly known as:  FERROUSUL     TAKE these medications   calcium carbonate 500 MG chewable tablet Commonly known as:  TUMS - dosed in mg elemental calcium Chew 1 tablet by mouth 3 (three) times daily as needed for indigestion or heartburn.   flintstones complete 60 MG chewable tablet Chew 2 tablets by mouth daily.   omeprazole 20 MG capsule Commonly known as:  PRILOSEC Take 20 mg by mouth daily as needed (For heatrburn.).      Follow-up Information    [facility]. Go to.   Specialty:  Obstetrics and Gynecology Why:  regular scheduled prenatal appointment Contact information: [address] [place] [phone]          Total time spent taking care of this patient: 20 minutes  Signed: Tresea MallJane Starleen Trussell, CNM  03/19/2018, 7:22 AM

## 2018-03-24 ENCOUNTER — Other Ambulatory Visit: Payer: Self-pay

## 2018-03-24 ENCOUNTER — Observation Stay
Admission: EM | Admit: 2018-03-24 | Discharge: 2018-03-25 | Disposition: A | Discharge status: Home / Self Care | Payer: Medicaid Other | Attending: Obstetrics and Gynecology | Admitting: Obstetrics and Gynecology

## 2018-03-24 ENCOUNTER — Encounter: Payer: Self-pay | Admitting: Obstetrics and Gynecology

## 2018-03-24 ENCOUNTER — Ambulatory Visit (INDEPENDENT_AMBULATORY_CARE_PROVIDER_SITE_OTHER): Payer: Medicaid Other | Admitting: Obstetrics and Gynecology

## 2018-03-24 VITALS — BP 118/68 | Wt 153.0 lb

## 2018-03-24 DIAGNOSIS — Z3A36 36 weeks gestation of pregnancy: Secondary | ICD-10-CM | POA: Insufficient documentation

## 2018-03-24 DIAGNOSIS — O09293 Supervision of pregnancy with other poor reproductive or obstetric history, third trimester: Secondary | ICD-10-CM

## 2018-03-24 DIAGNOSIS — O99013 Anemia complicating pregnancy, third trimester: Secondary | ICD-10-CM

## 2018-03-24 DIAGNOSIS — O0993 Supervision of high risk pregnancy, unspecified, third trimester: Secondary | ICD-10-CM

## 2018-03-24 DIAGNOSIS — O26893 Other specified pregnancy related conditions, third trimester: Secondary | ICD-10-CM | POA: Diagnosis present

## 2018-03-24 DIAGNOSIS — Z87891 Personal history of nicotine dependence: Secondary | ICD-10-CM | POA: Diagnosis not present

## 2018-03-24 DIAGNOSIS — O4703 False labor before 37 completed weeks of gestation, third trimester: Secondary | ICD-10-CM

## 2018-03-24 DIAGNOSIS — Z79899 Other long term (current) drug therapy: Secondary | ICD-10-CM | POA: Insufficient documentation

## 2018-03-24 DIAGNOSIS — O0991 Supervision of high risk pregnancy, unspecified, first trimester: Secondary | ICD-10-CM

## 2018-03-24 MED ORDER — ACETAMINOPHEN 500 MG PO TABS
ORAL_TABLET | ORAL | Status: AC
  Administered 2018-03-24: 1000 mg via ORAL
  Filled 2018-03-24: qty 2

## 2018-03-24 MED ORDER — ACETAMINOPHEN 500 MG PO TABS
1000.0000 mg | ORAL_TABLET | ORAL | Status: AC
  Administered 2018-03-24: 1000 mg via ORAL

## 2018-03-24 MED ORDER — ACETAMINOPHEN 500 MG PO TABS
1000.0000 mg | ORAL_TABLET | Freq: Four times a day (QID) | ORAL | Status: DC | PRN
  Administered 2018-03-25: 1000 mg via ORAL
  Filled 2018-03-24: qty 2

## 2018-03-24 NOTE — Progress Notes (Signed)
Patient ID: Townsend RogerAlexis R Dilday, female   DOB: 11/22/1995, 22 y.o.   MRN: [id number]  Labor Check  Subj:  Complaints: worsening contractions. Headache has returned   Obj:  BP 133/69   Pulse 76   Temp 98.3 F (36.8 C) (Oral)   Resp 18   Ht 5\' 4"  (1.626 m)   Wt 153 lb (69.4 kg)   LMP 07/12/2017 (Exact Date)   BMI 26.26 kg/m     Cervix: Dilation: 5.5 / Effacement (%): 60 / Station: -3  Baseline FHR: 140 beats/min   Variability: moderate   Accelerations: present   Decelerations: absent Contractions: present frequency: 3-4 q 10 min Overall assessment: cat 1  A/P: 22 y.o. [id number] female at 7124w3d with preterm labor, slowly progressing.  1.  Labor: continue to monitor.  Will not augment at this point.    2.  FWB: reassuring, Overall assessment: category 1. Patient s/p BMTZ on 6/9-6/10. Will not tocolyze at this point. 3.  GBS neg  4.  Pain: prn 5.  Recheck: prn. Will try to avoid rechecking soon. Will plan to watch overnight for stability versus progression.   Thomasene MohairStephen Chasidy Janak, MD, Merlinda FrederickFACOG [facility], [facility] 03/24/2018 10:06 PM

## 2018-03-24 NOTE — Progress Notes (Signed)
Routine Prenatal Care Visit  Subjective  Meagan Carpenter is a 22 y.o. [phone] at [redacted]w[redacted]d being seen today for ongoing prenatal care.  She is currently monitored for the following issues for this high-risk pregnancy and has Tension headache; Migraine headache without aura; Supervision of high risk pregnancy, antepartum, first trimester; Hx of preeclampsia, prior pregnancy, currently pregnant, third trimester; Nausea and vomiting during pregnancy prior to [redacted] weeks gestation; Anemia affecting pregnancy in third trimester; Preterm uterine contractions in third trimester, antepartum; Preterm labor in third trimester; and Indication for care in labor and delivery, antepartum on their problem list.  ----------------------------------------------------------------------------------- Patient reports elevated BPs at home (144/88) with headache not relieved by tylenol. notes visual changes, denies RUQ pain.   Contractions: Irregular. Vag. Bleeding: None.  Movement: (!) Decreased. Denies leaking of fluid.  ----------------------------------------------------------------------------------- The following portions of the patient's history were reviewed and updated as appropriate: allergies, current medications, past family history, past medical history, past social history, past surgical history and problem list. Problem list updated.   Objective  Blood pressure 118/68, weight 153 lb (69.4 kg), last menstrual period 07/12/2017. Pregravid weight 110 lb (49.9 kg) Total Weight Gain 43 lb (19.5 kg) Urinalysis: Urine Protein: Trace Urine Glucose: Negative  Fetal Status: Fetal Heart Rate (bpm): 150 Fundal Height: 35 cm Movement: (!) Decreased  Presentation: Vertex  General:  Alert, oriented and cooperative. Patient is in no acute distress.  Skin: Skin is warm and dry. No rash noted.   Cardiovascular: Normal heart rate noted  Respiratory: Normal respiratory effort, no problems with respiration noted  Abdomen:  Soft, gravid, appropriate for gestational age. Pain/Pressure: Present     Pelvic:  Cervical exam performed Dilation: 5 Effacement (%): 60 Station: -3  Extremities: Normal range of motion.  Edema: None  Mental Status: Normal mood and affect. Normal behavior. Normal judgment and thought content.   NST: Baseline FHR: 155 beats/min Variability: moderate Accelerations: present (one 15 x 15) Decelerations: absent Tocometry: not done  Interpretation:  INDICATIONS: decreased fetal movement RESULTS:  A NST procedure was performed with FHR monitoring and a normal baseline established, appropriate time of 20-40 minutes of evaluation, and accels >2 seen w 15x15 characteristics.  Results show a REACTIVE NST.    Assessment   21 y.o. [name] at [redacted]w[redacted]d by  04/18/2018, by Last Menstrual Period presenting for routine prenatal visit.  Plan   pregnancy#4 Problems (from 07/12/17 to present)    Problem Noted Resolved   Preterm labor in third trimester 03/11/2018 by Conard Novak, MD No   Anemia affecting pregnancy in third trimester 01/24/2018 by Oswaldo Conroy, CNM No   Supervision of high risk pregnancy, antepartum, first trimester 08/22/2017 by Natale Milch, MD No   Overview Addendum 09/30/2017  4:48 PM by Oswaldo Conroy, CNM    [facility]inic [facility] Prenatal Labs  Dating L=7 Blood type: O Positive  Genetic Screen NIPS: Negative, it's a girl! Antibody: Negative  Anatomic [place]  Rubella: Immune  Varicella: Immune  GTT Early:               Third trimester:  RPR: NR  Rhogam  HBsAg: Negative  TDaP vaccine                       Flu Shot: HIV: NR  [employer]  GBS:   Contraception  Pap: 08/22/2017 NILM/HPV Negative  CBB     CS/VBAC    Support Person              Hx of preeclampsia, prior pregnancy, currently pregnant, third trimester 08/22/2017 by Natale MilchSchuman, Christanna R, MD No       Preterm labor symptoms and general obstetric precautions including but not  limited to vaginal bleeding, contractions, leaking of fluid and fetal movement were reviewed in detail with the patient. Please refer to After Visit Summary for other counseling recommendations.   - To L&D for further evaluation of advanced dilation and decreased fetal movement with reassuring, but non-reactive NST  Return in about 1 week (around 03/31/2018) for Routine Prenatal Appointment.  Meagan MohairStephen Luverne Zerkle, MD, Merlinda FrederickFACOG [facility], [facility] 03/24/2018 2:30 PM

## 2018-03-24 NOTE — OB Triage Note (Signed)
[facility] and decreased FM. Pt denies VB and LOF. Pt states she does have a headache that doesn't get better with rest or medications and pt sees spots with dizziness for the past few days. Initial BP 131/76. Fetal monitors applied and assessing initial FHT 175.

## 2018-03-25 DIAGNOSIS — O4703 False labor before 37 completed weeks of gestation, third trimester: Secondary | ICD-10-CM | POA: Diagnosis not present

## 2018-03-25 NOTE — Discharge Summary (Signed)
Discharge Summary  Patient ID: Meagan Carpenter MRN: [id number] DOB/AGE: 22/03/1996 21 y.o.  Admit date: 03/24/2018 Admitting provider: Conard NovakStephen D Ziyan Hillmer, MD Discharge date: 03/25/2018   Admission Diagnoses:  1) intrauterine pregnancy at 6118w4d  2) preterm labor without delivery, third trimester  Discharge Diagnoses:  1) intrauterine pregnancy at 3118w4d  2) preterm labor without delivery, third trimester  History of Present Illness: The patient is a 22 y.o. female [phone] at 6218w4d who presents for preterm contractions and demonstrated cervical change.  She was seen in clinic and noted contractions. Her cervix, which had been stable at 4 cm, changed to 5 cm.  She was sent to the hospital for further evaluation of monitoring. She denied leakage of fluid and vaginal bleeding.  She noted positive fetal movement. She had been previously admitted for preterm labor with dilation to 4 cm (2 weeks ago) and is s/p BMTZ x 2.   Hospital course: patient admitted for observation overnight. Her cervix initially changed to 5.5 cm.  She continued to have regular uterine contractions.  However, her contractions dissipated and her exam was stable over 10 hours and was essentially unchanged from admission and her exam in clinic. Her vitals were normal and stable. Her fetal tracing was reactive and category 1.  Given her stability, she was deemed appropriate for discharge with strict precautions to return, if symptoms should develop.   Past Medical History:  Diagnosis Date  . Chlamydia   . Headache(784.0)   . Pre-eclampsia   . UTI (lower urinary tract infection)     Past Surgical History:  Procedure Laterality Date  . ELBOW FRACTURE SURGERY  2012    No current facility-administered medications on file prior to encounter.    Current Outpatient Medications on File Prior to Encounter  Medication Sig Dispense Refill  . calcium carbonate (TUMS - DOSED IN MG ELEMENTAL CALCIUM) 500 MG chewable tablet Chew 1  tablet by mouth 3 (three) times daily as needed for indigestion or heartburn.    . flintstones complete (FLINTSTONES) 60 MG chewable tablet Chew 2 tablets by mouth daily.    Marland Kitchen. omeprazole (PRILOSEC) 20 MG capsule Take 20 mg by mouth daily as needed (For heatrburn.).     Allergies: No Known Allergies  Social History   Socioeconomic History  . Marital status: Single    Spouse name: Not on file  . Number of children: Not on file  . Years of education: Not on file  . Highest education level: Not on file  Occupational History  . Not on file  Social Needs  . Financial resource strain: Not on file  . Food insecurity:    Worry: Not on file    Inability: Not on file  . Transportation needs:    Medical: Not on file    Non-medical: Not on file  Tobacco Use  . Smoking status: Former Smoker    Types: Cigarettes    Last attempt to quit: 01/01/2010    Years since quitting: 8.2  . Smokeless tobacco: Never Used  Substance and Sexual Activity  . Alcohol use: Not Currently    Alcohol/week: 0.0 oz  . Drug use: No  . Sexual activity: Yes    Partners: Male    Birth control/protection: Surgical    Comment: BTL  Lifestyle  . Physical activity:    Days per week: Not on file    Minutes per session: Not on file  . Stress: Not on file  Relationships  . Social connections:  Talks on phone: Not on file    Gets together: Not on file    Attends religious service: Not on file    Active member of club or organization: Not on file    Attends meetings of clubs or organizations: Not on file    Relationship status: Not on file  . Intimate partner violence:    Fear of current or ex partner: Not on file    Emotionally abused: Not on file    Physically abused: Not on file    Forced sexual activity: Not on file  Other Topics Concern  . Not on file  Social History Narrative  . Not on file    Physical Exam: BP 127/67   Pulse 70   Temp 98.4 F (36.9 C) (Oral)   Resp 18   Ht 5\' 4"  (1.626 m)    Wt 153 lb (69.4 kg)   LMP 07/12/2017 (Exact Date)   BMI 26.26 kg/m   Gen: NAD CV: RRR Pulm: CTAB Pelvic: 5/50/-3 Ext: no e/c/t  Consults: None  Significant Findings/ Diagnostic Studies: none  Procedures: NST Baseline FHR: 135 beats/min Variability: moderate Accelerations: present Decelerations: absent Tocometry: 1 ctx q 10 min  Discharge Condition: stable  Disposition: Discharge disposition: 01-Home or Self Care       Diet: Regular diet  Discharge Activity: Activity as tolerated   Allergies as of 03/25/2018   No Known Allergies     Medication List    TAKE these medications   calcium carbonate 500 MG chewable tablet Commonly known as:  TUMS - dosed in mg elemental calcium Chew 1 tablet by mouth 3 (three) times daily as needed for indigestion or heartburn.   flintstones complete 60 MG chewable tablet Chew 2 tablets by mouth daily.   omeprazole 20 MG capsule Commonly known as:  PRILOSEC Take 20 mg by mouth daily as needed (For heatrburn.).      Follow-up Information    Conard Novak, MD Follow up on 03/29/2018.   Specialty:  Obstetrics and Gynecology Why:  Keep previously scheduled appointment Contact information: [address] [place] [address] [phone]           Total time spent taking care of this patient: 45 minutes  Signed: [name], MD  03/25/2018, 7:26 AM

## 2018-03-29 ENCOUNTER — Ambulatory Visit (INDEPENDENT_AMBULATORY_CARE_PROVIDER_SITE_OTHER): Payer: Medicaid Other | Admitting: Obstetrics and Gynecology

## 2018-03-29 ENCOUNTER — Encounter: Payer: Self-pay | Admitting: Obstetrics and Gynecology

## 2018-03-29 VITALS — BP 112/78 | Wt 157.0 lb

## 2018-03-29 DIAGNOSIS — O0991 Supervision of high risk pregnancy, unspecified, first trimester: Secondary | ICD-10-CM

## 2018-03-29 DIAGNOSIS — Z3A37 37 weeks gestation of pregnancy: Secondary | ICD-10-CM

## 2018-03-29 DIAGNOSIS — O09293 Supervision of pregnancy with other poor reproductive or obstetric history, third trimester: Secondary | ICD-10-CM

## 2018-03-29 DIAGNOSIS — O99013 Anemia complicating pregnancy, third trimester: Secondary | ICD-10-CM

## 2018-03-29 NOTE — Progress Notes (Signed)
No complaints

## 2018-03-29 NOTE — Progress Notes (Signed)
Routine Prenatal Care Visit  Subjective  Meagan Carpenter is a 22 y.o. [phone] at [redacted]w[redacted]d being seen today for ongoing prenatal care.  She is currently monitored for the following issues for this high-risk pregnancy and has Tension headache; Migraine headache without aura; Supervision of high risk pregnancy, antepartum, first trimester; Hx of preeclampsia, prior pregnancy, currently pregnant, third trimester; Nausea and vomiting during pregnancy prior to [redacted] weeks gestation; Anemia affecting pregnancy in third trimester; Preterm uterine contractions in third trimester, antepartum; Preterm labor in third trimester; and Indication for care in labor and delivery, antepartum on their problem list.  ----------------------------------------------------------------------------------- Patient reports no complaints.   Contractions: Irregular. Vag. Bleeding: None.  Movement: Present. Denies leaking of fluid.  ----------------------------------------------------------------------------------- The following portions of the patient's history were reviewed and updated as appropriate: allergies, current medications, past family history, past medical history, past social history, past surgical history and problem list. Problem list updated.   Objective  Blood pressure 112/78, weight 157 lb (71.2 kg), last menstrual period 07/12/2017. Pregravid weight 110 lb (49.9 kg) Total Weight Gain 47 lb (21.3 kg) Urinalysis:      Fetal Status: Fetal Heart Rate (bpm): 155 Fundal Height: 36 cm Movement: Present  Presentation: Vertex  General:  Alert, oriented and cooperative. Patient is in no acute distress.  Skin: Skin is warm and dry. No rash noted.   Cardiovascular: Normal heart rate noted  Respiratory: Normal respiratory effort, no problems with respiration noted  Abdomen: Soft, gravid, appropriate for gestational age. Pain/Pressure: Present     Pelvic:  Cervical exam deferred        Extremities: Normal range of  motion.     Mental Status: Normal mood and affect. Normal behavior. Normal judgment and thought content.   Assessment   22 y.o. [id number] at [redacted]w[redacted]d by  04/18/2018, by Last Menstrual Period presenting for routine prenatal visit  Plan   pregnancy#4 Problems (from 07/12/17 to present)    Problem Noted Resolved   Preterm labor in third trimester 03/11/2018 by Conard Novak, MD No   Anemia affecting pregnancy in third trimester 01/24/2018 by Oswaldo Conroy, CNM No   Supervision of high risk pregnancy, antepartum, first trimester 08/22/2017 by Natale Milch, MD No   Overview Addendum 09/30/2017  4:48 PM by Oswaldo Conroy, CNM    [facility] Prenatal Labs  Dating L=7 Blood type: O Positive  Genetic Screen NIPS: Negative, it's a girl! Antibody: Negative  Anatomic [place]  Rubella: Immune  Varicella: Immune  GTT Early:               Third trimester:  RPR: NR  Rhogam  HBsAg: Negative  TDaP vaccine                       Flu Shot: HIV: NR  Baby Food                                GBS:   Contraception  Pap: 08/22/2017 NILM/HPV Negative  CBB     CS/VBAC    Support Person              Hx of preeclampsia, prior pregnancy, currently pregnant, third trimester 08/22/2017 by Natale Milch, MD No       Term labor symptoms and general obstetric precautions including but not limited to vaginal bleeding, contractions, leaking of fluid and fetal movement were reviewed in detail  with the patient. Please refer to After Visit Summary for other counseling recommendations.   Return in about 1 week (around 04/05/2018) for Routine Prenatal Appointment.  Thomasene MohairStephen Jackson, MD, Merlinda FrederickFACOG [facility], [facility] 03/29/2018 2:30 PM

## 2018-03-29 NOTE — Progress Notes (Incomplete)
Routine Prenatal Care Visit  Subjective  Meagan Carpenter is a 22 y.o. [phone] at [redacted]w[redacted]d being seen today for ongoing prenatal care.  She is currently monitored for the following issues for this {Blank single:19197::"high-risk","low-risk"} pregnancy and has Tension headache; Migraine headache without aura; Supervision of high risk pregnancy, antepartum, first trimester; Hx of preeclampsia, prior pregnancy, currently pregnant, third trimester; Nausea and vomiting during pregnancy prior to [redacted] weeks gestation; Anemia affecting pregnancy in third trimester; Preterm uterine contractions in third trimester, antepartum; Preterm labor in third trimester; and Indication for care in labor and delivery, antepartum on their problem list.  ----------------------------------------------------------------------------------- Patient reports {sx:14538}.   Contractions: Irregular. Vag. Bleeding: None.  Movement: Present. Denies leaking of fluid.  ----------------------------------------------------------------------------------- The following portions of the patient's history were reviewed and updated as appropriate: allergies, current medications, past family history, past medical history, past social history, past surgical history and problem list. Problem list updated.   Objective  Blood pressure 112/78, weight 157 lb (71.2 kg), last menstrual period 07/12/2017. Pregravid weight 110 lb (49.9 kg) Total Weight Gain 47 lb (21.3 kg) Urinalysis:      Fetal Status: Fetal Heart Rate (bpm): 155 Fundal Height: 36 cm Movement: Present  Presentation: Vertex  General:  Alert, oriented and cooperative. Patient is in no acute distress.  Skin: Skin is warm and dry. No rash noted.   Cardiovascular: Normal heart rate noted  Respiratory: Normal respiratory effort, no problems with respiration noted  Abdomen: Soft, gravid, appropriate for gestational age. Pain/Pressure: Present     Pelvic:  {Blank single:19197::"Cervical  exam performed","Cervical exam deferred"}        Extremities: Normal range of motion.     Mental Status: Normal mood and affect. Normal behavior. Normal judgment and thought content.   Assessment   22 y.o. [name] at [redacted]w[redacted]d by  04/18/2018, by Last Menstrual Period presenting for {Blank single:19197::"routine","work-in"} prenatal visit  Plan   pregnancy#4 Problems (from 07/12/17 to present)    Problem Noted Resolved   Preterm labor in third trimester 03/11/2018 by Conard Novak, MD No   Anemia affecting pregnancy in third trimester 01/24/2018 by Oswaldo Conroy, CNM No   Supervision of high risk pregnancy, antepartum, first trimester 08/22/2017 by Natale Milch, MD No   Overview Addendum 09/30/2017  4:48 PM by Oswaldo Conroy, CNM    [facility] Prenatal Labs  Dating L=7 Blood type: O Positive  Genetic Screen NIPS: Negative, it's a girl! Antibody: Negative  Anatomic [place]  Rubella: Immune  Varicella: Immune  GTT Early:               Third trimester:  RPR: NR  Rhogam  HBsAg: Negative  TDaP vaccine                       Flu Shot: HIV: NR  Baby Food                                GBS:   Contraception  Pap: 08/22/2017 NILM/HPV Negative  CBB     CS/VBAC    Support Person              Hx of preeclampsia, prior pregnancy, currently pregnant, third trimester 08/22/2017 by Natale Milch, MD No       {Blank single:19197::"Term","Preterm"} labor symptoms and general obstetric precautions including but not limited to vaginal bleeding, contractions, leaking of fluid and fetal  movement were reviewed in detail with the patient. Please refer to After Visit Summary for other counseling recommendations.   Return in about 1 week (around 04/05/2018) for Routine Prenatal Appointment.  Thomasene MohairStephen Autrey Human, MD, Merlinda FrederickFACOG [facility], [facility] 03/29/2018 2:30 PM

## 2018-04-05 ENCOUNTER — Ambulatory Visit (INDEPENDENT_AMBULATORY_CARE_PROVIDER_SITE_OTHER): Payer: Self-pay | Admitting: Maternal Newborn

## 2018-04-05 ENCOUNTER — Encounter: Payer: Self-pay | Admitting: Maternal Newborn

## 2018-04-05 VITALS — BP 120/80 | Wt 156.0 lb

## 2018-04-05 DIAGNOSIS — O0991 Supervision of high risk pregnancy, unspecified, first trimester: Secondary | ICD-10-CM

## 2018-04-05 DIAGNOSIS — O99013 Anemia complicating pregnancy, third trimester: Secondary | ICD-10-CM

## 2018-04-05 DIAGNOSIS — Z3A38 38 weeks gestation of pregnancy: Secondary | ICD-10-CM

## 2018-04-05 NOTE — Progress Notes (Signed)
C/o ctxs - [email or web address]

## 2018-04-05 NOTE — Progress Notes (Signed)
Routine Prenatal Care Visit  Subjective  Meagan Carpenter is a 22 y.o. [phone] at 2953w1d being seen today for ongoing prenatal care.  She is currently monitored for the following issues for this high-risk pregnancy and has Tension headache; Migraine headache without aura; Supervision of high risk pregnancy, antepartum, first trimester; Hx of preeclampsia, prior pregnancy, currently pregnant, third trimester; Nausea and vomiting during pregnancy prior to [redacted] weeks gestation; Anemia affecting pregnancy in third trimester; Preterm uterine contractions in third trimester, antepartum; Preterm labor in third trimester; and Indication for care in labor and delivery, antepartum on their problem list.  ----------------------------------------------------------------------------------- Patient reports irregular contractions.   Contractions: Irregular. Vag. Bleeding: None.  Movement: Present. No leaking of fluid.  ----------------------------------------------------------------------------------- The following portions of the patient's history were reviewed and updated as appropriate: allergies, current medications, past family history, past medical history, past social history, past surgical history and problem list. Problem list updated.   Objective  Blood pressure 120/80, weight 156 lb (70.8 kg), last menstrual period 07/12/2017. Pregravid weight 110 lb (49.9 kg) Total Weight Gain 46 lb (20.9 kg) Body mass index is 26.78 kg/m.  Urinalysis: Urine Protein: Trace Urine Glucose: Negative  Fetal Status: Fetal Heart Rate (bpm): 158 Fundal Height: 37 cm Movement: Present  Presentation: Vertex  General:  Alert, oriented and cooperative. Patient is in no acute distress.  Skin: Skin is warm and dry. No rash noted.   Cardiovascular: Normal heart rate noted  Respiratory: Normal respiratory effort, no problems with respiration noted  Abdomen: Soft, gravid, appropriate for gestational age. Pain/Pressure:  Present     Pelvic:  Cervical exam performed Dilation: 5 Effacement (%): 60 Station: -2  Extremities: Normal range of motion.  Edema: Trace  Mental Status: Normal mood and affect. Normal behavior. Normal judgment and thought content.     Assessment   21 y.o. [id number] at 6653w1d, EDD 04/18/2018 by Last Menstrual Period presenting for routine prenatal visit.  Plan   pregnancy#4 Problems (from 07/12/17 to present)    Problem Noted Resolved   Preterm labor in third trimester 03/11/2018 by Conard NovakJackson, Stephen D, MD No   Anemia affecting pregnancy in third trimester 01/24/2018 by Oswaldo ConroySchmid, Dequincy Born Y, CNM No   Supervision of high risk pregnancy, antepartum, first trimester 08/22/2017 by Natale MilchSchuman, Christanna R, MD No   Overview Addendum 09/30/2017  4:48 PM by Oswaldo ConroySchmid, Teegan Brandis Y, CNM    [facility] Prenatal Labs  Dating L=7 Blood type: O Positive  Genetic Screen NIPS: Negative, it's a girl! Antibody: Negative  Anatomic US  Rubella: Immune  Varicella: Immune  GTT Early:               Third trimester:  RPR: NR  Rhogam  HBsAg: Negative  TDaP vaccine                       Flu Shot: HIV: NR  Baby Food                                GBS:   Contraception  Pap: 08/22/2017 NILM/HPV Negative  CBB     CS/VBAC    Support Person              Hx of preeclampsia, prior pregnancy, currently pregnant, third trimester 08/22/2017 by Natale MilchSchuman, Christanna R, MD No    She desires to schedule an induction of labor at 39 weeks. Earliest available date is  7/11 at 0800; she is currently scheduled for that date and time.  Term labor symptoms and general obstetric precautions including but not limited to vaginal bleeding, contractions, leaking of fluid and fetal movement were reviewed in detail with the patient.  Return in about 1 week (around 04/12/2018) for ROB.  Marcelyn Bruins, CNM 04/05/2018  3:39 PM

## 2018-04-12 ENCOUNTER — Encounter: Payer: Self-pay | Admitting: Maternal Newborn

## 2018-04-12 ENCOUNTER — Ambulatory Visit (INDEPENDENT_AMBULATORY_CARE_PROVIDER_SITE_OTHER): Payer: Medicaid Other | Admitting: Maternal Newborn

## 2018-04-12 VITALS — BP 120/70 | Wt 160.0 lb

## 2018-04-12 DIAGNOSIS — O09293 Supervision of pregnancy with other poor reproductive or obstetric history, third trimester: Secondary | ICD-10-CM

## 2018-04-12 DIAGNOSIS — O0991 Supervision of high risk pregnancy, unspecified, first trimester: Secondary | ICD-10-CM

## 2018-04-12 DIAGNOSIS — Z3A39 39 weeks gestation of pregnancy: Secondary | ICD-10-CM

## 2018-04-12 NOTE — Progress Notes (Signed)
Routine Prenatal Care Visit  Subjective  Townsend Rogerlexis R Pagliarulo is a 22 y.o. [phone] at 6840w1d being seen today for ongoing prenatal care.  She is currently monitored for the following issues for this high-risk pregnancy and has Tension headache; Migraine headache without aura; Supervision of high risk pregnancy, antepartum, first trimester; Hx of preeclampsia, prior pregnancy, currently pregnant, third trimester; Nausea and vomiting during pregnancy prior to [redacted] weeks gestation; Anemia affecting pregnancy in third trimester; Preterm uterine contractions in third trimester, antepartum; and Preterm labor in third trimester on their problem list.  ----------------------------------------------------------------------------------- Patient reports swelling in her feet. She had a migraine headache last night that has resolved.  Contractions: Irregular. Vag. Bleeding: None.  Movement: Present. No leaking of fluid.  ----------------------------------------------------------------------------------- The following portions of the patient's history were reviewed and updated as appropriate: allergies, current medications, past family history, past medical history, past social history, past surgical history and problem list. Problem list updated.   Objective  Blood pressure 120/70, weight 160 lb (72.6 kg), last menstrual period 07/12/2017. Pregravid weight 110 lb (49.9 kg) Total Weight Gain 50 lb (22.7 kg) Body mass index is 27.46 kg/m. Urinalysis: Urine Protein: Trace Urine Glucose: Negative  Fetal Status: Fetal Heart Rate (bpm): 160 Fundal Height: 38 cm Movement: Present     General:  Alert, oriented and cooperative. Patient is in no acute distress.  Skin: Skin is warm and dry. No rash noted.   Cardiovascular: Normal heart rate noted  Respiratory: Normal respiratory effort, no problems with respiration noted  Abdomen: Soft, gravid, appropriate for gestational age. Pain/Pressure: Present     Pelvic:   Cervical exam deferred        Extremities: Normal range of motion.  Edema: Mild pitting, slight indentation  Mental Status: Normal mood and affect. Normal behavior. Normal judgment and thought content.   Declined cervical exam today, exam last week 5/60/-2  Assessment   21 y.o. [id number] at 5540w1d, EDD 04/18/2018 by Last Menstrual Period presenting for routine prenatal visit.  Plan   pregnancy#4 Problems (from 07/12/17 to present)    Problem Noted Resolved   Preterm labor in third trimester 03/11/2018 by Conard NovakJackson, Stephen D, MD No   Anemia affecting pregnancy in third trimester 01/24/2018 by Oswaldo ConroySchmid, Florena Kozma Y, CNM No   Supervision of high risk pregnancy, antepartum, first trimester 08/22/2017 by Natale MilchSchuman, Christanna R, MD No   Overview Addendum 09/30/2017  4:48 PM by Oswaldo ConroySchmid, Tandy Lewin Y, CNM    Clinic [facility] Prenatal Labs  Dating L=7 Blood type: O Positive  Genetic Screen NIPS: Negative, it's a girl! Antibody: Negative  Anatomic US  Rubella: Immune  Varicella: Immune  GTT Early:               Third trimester:  RPR: NR  Rhogam  HBsAg: Negative  TDaP vaccine                       Flu Shot: HIV: NR  Baby Food                                GBS:   Contraception  Pap: 08/22/2017 NILM/HPV Negative  CBB     CS/VBAC    Support Person              Hx of preeclampsia, prior pregnancy, currently pregnant, third trimester 08/22/2017 by Natale MilchSchuman, Christanna R, MD No      Term labor  symptoms and general obstetric precautions were reviewed.  IOL 04/13/2018 at 0800.  Marcelyn Bruins, CNM 04/12/2018  2:37 PM

## 2018-04-12 NOTE — Progress Notes (Signed)
No concerns.rj 

## 2018-04-13 ENCOUNTER — Inpatient Hospital Stay: Payer: Medicaid Other | Admitting: Anesthesiology

## 2018-04-13 ENCOUNTER — Other Ambulatory Visit: Payer: Self-pay

## 2018-04-13 ENCOUNTER — Inpatient Hospital Stay
Admission: EM | Admit: 2018-04-13 | Discharge: 2018-04-15 | DRG: 797 | Disposition: A | Discharge status: Home / Self Care | Payer: Medicaid Other | Attending: Obstetrics and Gynecology | Admitting: Obstetrics and Gynecology

## 2018-04-13 DIAGNOSIS — Z3A39 39 weeks gestation of pregnancy: Secondary | ICD-10-CM

## 2018-04-13 DIAGNOSIS — D62 Acute posthemorrhagic anemia: Secondary | ICD-10-CM | POA: Diagnosis not present

## 2018-04-13 DIAGNOSIS — O99345 Other mental disorders complicating the puerperium: Secondary | ICD-10-CM | POA: Diagnosis not present

## 2018-04-13 DIAGNOSIS — O99013 Anemia complicating pregnancy, third trimester: Secondary | ICD-10-CM

## 2018-04-13 DIAGNOSIS — Z818 Family history of other mental and behavioral disorders: Secondary | ICD-10-CM | POA: Diagnosis not present

## 2018-04-13 DIAGNOSIS — Z3483 Encounter for supervision of other normal pregnancy, third trimester: Secondary | ICD-10-CM | POA: Diagnosis present

## 2018-04-13 DIAGNOSIS — Z9851 Tubal ligation status: Secondary | ICD-10-CM

## 2018-04-13 DIAGNOSIS — O0991 Supervision of high risk pregnancy, unspecified, first trimester: Secondary | ICD-10-CM

## 2018-04-13 DIAGNOSIS — F53 Postpartum depression: Secondary | ICD-10-CM | POA: Diagnosis not present

## 2018-04-13 DIAGNOSIS — Z302 Encounter for sterilization: Secondary | ICD-10-CM | POA: Diagnosis not present

## 2018-04-13 DIAGNOSIS — O09293 Supervision of pregnancy with other poor reproductive or obstetric history, third trimester: Secondary | ICD-10-CM

## 2018-04-13 DIAGNOSIS — O9902 Anemia complicating childbirth: Principal | ICD-10-CM | POA: Diagnosis present

## 2018-04-13 DIAGNOSIS — F339 Major depressive disorder, recurrent, unspecified: Secondary | ICD-10-CM | POA: Diagnosis present

## 2018-04-13 LAB — CBC
HEMATOCRIT: 28.7 % — AB (ref 35.0–47.0)
HEMOGLOBIN: 9.3 g/dL — AB (ref 12.0–16.0)
MCH: 25.1 pg — AB (ref 26.0–34.0)
MCHC: 32.4 g/dL (ref 32.0–36.0)
MCV: 77.5 fL — ABNORMAL LOW (ref 80.0–100.0)
Platelets: 247 10*3/uL (ref 150–440)
RBC: 3.7 MIL/uL — ABNORMAL LOW (ref 3.80–5.20)
RDW: 16.3 % — ABNORMAL HIGH (ref 11.5–14.5)
WBC: 12 10*3/uL — ABNORMAL HIGH (ref 3.6–11.0)

## 2018-04-13 LAB — CHLAMYDIA/NGC RT PCR (ARMC ONLY)
Chlamydia Tr: NOT DETECTED
N GONORRHOEAE: NOT DETECTED

## 2018-04-13 LAB — TYPE AND SCREEN
ABO/RH(D): O POS
Antibody Screen: NEGATIVE

## 2018-04-13 MED ORDER — SIMETHICONE 80 MG PO CHEW: 80.0000 mg | CHEWABLE_TABLET | ORAL | Status: DC | PRN

## 2018-04-13 MED ORDER — DIBUCAINE 1 % RE OINT: 1.0000 "application " | TOPICAL_OINTMENT | RECTAL | Status: DC | PRN

## 2018-04-13 MED ORDER — FENTANYL 2.5 MCG/ML W/ROPIVACAINE 0.15% IN NS 100 ML EPIDURAL (ARMC)
EPIDURAL | Status: AC
  Filled 2018-04-13: qty 100

## 2018-04-13 MED ORDER — WITCH HAZEL-GLYCERIN EX PADS: 1.0000 "application " | MEDICATED_PAD | CUTANEOUS | Status: DC | PRN

## 2018-04-13 MED ORDER — PANTOPRAZOLE SODIUM 40 MG PO TBEC
40.0000 mg | DELAYED_RELEASE_TABLET | Freq: Every day | ORAL | Status: DC
  Filled 2018-04-13: qty 1

## 2018-04-13 MED ORDER — ONDANSETRON HCL 4 MG/2ML IJ SOLN: 4.0000 mg | INTRAMUSCULAR | Status: DC | PRN

## 2018-04-13 MED ORDER — DOCUSATE SODIUM 100 MG PO CAPS
100.0000 mg | ORAL_CAPSULE | Freq: Two times a day (BID) | ORAL | Status: DC
  Administered 2018-04-14: 100 mg via ORAL
  Filled 2018-04-13 (×2): qty 1

## 2018-04-13 MED ORDER — ONDANSETRON HCL 4 MG PO TABS: 4.0000 mg | ORAL_TABLET | ORAL | Status: DC | PRN

## 2018-04-13 MED ORDER — METHYLERGONOVINE MALEATE 0.2 MG/ML IJ SOLN
INTRAMUSCULAR | Status: AC
  Administered 2018-04-13: 0.2 mg
  Filled 2018-04-13: qty 1

## 2018-04-13 MED ORDER — ZOLPIDEM TARTRATE 5 MG PO TABS: 5.0000 mg | ORAL_TABLET | Freq: Every evening | ORAL | Status: DC | PRN

## 2018-04-13 MED ORDER — LIDOCAINE-EPINEPHRINE (PF) 1.5 %-1:200000 IJ SOLN
INTRAMUSCULAR | Status: DC | PRN
  Administered 2018-04-13: 3 mL via EPIDURAL

## 2018-04-13 MED ORDER — OXYTOCIN 40 UNITS IN LACTATED RINGERS INFUSION - SIMPLE MED
1.0000 m[IU]/min | INTRAVENOUS | Status: DC
  Administered 2018-04-13: 2 [IU] via INTRAVENOUS

## 2018-04-13 MED ORDER — LACTATED RINGERS IV SOLN: INTRAVENOUS | Status: DC

## 2018-04-13 MED ORDER — OXYTOCIN 10 UNIT/ML IJ SOLN: 10.0000 [IU] | Freq: Once | INTRAMUSCULAR | Status: DC

## 2018-04-13 MED ORDER — LIDOCAINE HCL (PF) 1 % IJ SOLN: 30.0000 mL | INTRAMUSCULAR | Status: DC | PRN

## 2018-04-13 MED ORDER — SOD CITRATE-CITRIC ACID 500-334 MG/5ML PO SOLN
30.0000 mL | ORAL | Status: DC | PRN
  Administered 2018-04-13: 30 mL via ORAL
  Filled 2018-04-13: qty 15

## 2018-04-13 MED ORDER — AMMONIA AROMATIC IN INHA
RESPIRATORY_TRACT | Status: AC
  Filled 2018-04-13: qty 10

## 2018-04-13 MED ORDER — BENZOCAINE-MENTHOL 20-0.5 % EX AERO: 1.0000 "application " | INHALATION_SPRAY | CUTANEOUS | Status: DC | PRN

## 2018-04-13 MED ORDER — MISOPROSTOL 200 MCG PO TABS
ORAL_TABLET | ORAL | Status: AC
  Administered 2018-04-13: 800 ug
  Filled 2018-04-13: qty 4

## 2018-04-13 MED ORDER — ACETAMINOPHEN 325 MG PO TABS: 650.0000 mg | ORAL_TABLET | ORAL | Status: DC | PRN

## 2018-04-13 MED ORDER — FENTANYL 2.5 MCG/ML W/ROPIVACAINE 0.15% IN NS 100 ML EPIDURAL (ARMC): 12.0000 mL/h | EPIDURAL | Status: DC

## 2018-04-13 MED ORDER — OXYTOCIN BOLUS FROM INFUSION
500.0000 mL | Freq: Once | INTRAVENOUS | Status: AC
  Administered 2018-04-13: 500 mL via INTRAVENOUS

## 2018-04-13 MED ORDER — EPHEDRINE 5 MG/ML INJ
10.0000 mg | INTRAVENOUS | Status: DC | PRN
  Filled 2018-04-13: qty 2

## 2018-04-13 MED ORDER — BUPIVACAINE HCL (PF) 0.25 % IJ SOLN
INTRAMUSCULAR | Status: DC | PRN
  Administered 2018-04-13: 5 mL via EPIDURAL

## 2018-04-13 MED ORDER — IBUPROFEN 600 MG PO TABS
600.0000 mg | ORAL_TABLET | Freq: Four times a day (QID) | ORAL | Status: DC
  Administered 2018-04-14 – 2018-04-15 (×4): 600 mg via ORAL
  Filled 2018-04-13 (×4): qty 1

## 2018-04-13 MED ORDER — OXYTOCIN 40 UNITS IN LACTATED RINGERS INFUSION - SIMPLE MED
INTRAVENOUS | Status: AC
  Administered 2018-04-13: 500 mL via INTRAVENOUS
  Filled 2018-04-13: qty 1000

## 2018-04-13 MED ORDER — EPHEDRINE 5 MG/ML INJ
INTRAVENOUS | Status: AC
  Filled 2018-04-13: qty 4

## 2018-04-13 MED ORDER — DIPHENHYDRAMINE HCL 25 MG PO CAPS: 25.0000 mg | ORAL_CAPSULE | Freq: Four times a day (QID) | ORAL | Status: DC | PRN

## 2018-04-13 MED ORDER — DIPHENHYDRAMINE HCL 50 MG/ML IJ SOLN: 12.5000 mg | INTRAMUSCULAR | Status: DC | PRN

## 2018-04-13 MED ORDER — OXYTOCIN 10 UNIT/ML IJ SOLN
INTRAMUSCULAR | Status: AC
  Filled 2018-04-13: qty 2

## 2018-04-13 MED ORDER — OXYTOCIN 40 UNITS IN LACTATED RINGERS INFUSION - SIMPLE MED: 2.5000 [IU]/h | INTRAVENOUS | Status: DC

## 2018-04-13 MED ORDER — FENTANYL 2.5 MCG/ML W/ROPIVACAINE 0.15% IN NS 100 ML EPIDURAL (ARMC)
EPIDURAL | Status: DC | PRN
  Administered 2018-04-13: 12 mL/h via EPIDURAL

## 2018-04-13 MED ORDER — ONDANSETRON HCL 4 MG/2ML IJ SOLN
4.0000 mg | Freq: Four times a day (QID) | INTRAMUSCULAR | Status: DC | PRN
  Administered 2018-04-13: 4 mg via INTRAVENOUS
  Filled 2018-04-13: qty 2

## 2018-04-13 MED ORDER — TERBUTALINE SULFATE 1 MG/ML IJ SOLN: 0.2500 mg | Freq: Once | INTRAMUSCULAR | Status: DC | PRN

## 2018-04-13 MED ORDER — LACTATED RINGERS IV SOLN: 500.0000 mL | Freq: Once | INTRAVENOUS | Status: DC

## 2018-04-13 MED ORDER — PHENYLEPHRINE 40 MCG/ML (10ML) SYRINGE FOR IV PUSH (FOR BLOOD PRESSURE SUPPORT)
80.0000 ug | PREFILLED_SYRINGE | INTRAVENOUS | Status: DC | PRN
  Filled 2018-04-13: qty 5

## 2018-04-13 MED ORDER — LIDOCAINE HCL (PF) 1 % IJ SOLN
INTRAMUSCULAR | Status: DC | PRN
  Administered 2018-04-13: 3 mL via SUBCUTANEOUS

## 2018-04-13 MED ORDER — PRENATAL MULTIVITAMIN CH
1.0000 | ORAL_TABLET | Freq: Every day | ORAL | Status: DC
  Administered 2018-04-15: 1 via ORAL
  Filled 2018-04-13: qty 1

## 2018-04-13 MED ORDER — MAGNESIUM HYDROXIDE 400 MG/5ML PO SUSP
30.0000 mL | ORAL | Status: DC | PRN
  Administered 2018-04-15: 30 mL via ORAL
  Filled 2018-04-13: qty 30

## 2018-04-13 MED ORDER — BENZOCAINE-MENTHOL 20-0.5 % EX AERO
INHALATION_SPRAY | CUTANEOUS | Status: AC
  Filled 2018-04-13: qty 56

## 2018-04-13 MED ORDER — LACTATED RINGERS IV SOLN
500.0000 mL | INTRAVENOUS | Status: DC | PRN
  Administered 2018-04-13: 500 mL via INTRAVENOUS

## 2018-04-13 MED ORDER — LIDOCAINE HCL (PF) 1 % IJ SOLN
INTRAMUSCULAR | Status: AC
  Filled 2018-04-13: qty 30

## 2018-04-13 MED ORDER — COCONUT OIL OIL: 1.0000 "application " | TOPICAL_OIL | Status: DC | PRN

## 2018-04-13 NOTE — Discharge Summary (Signed)
OB Discharge Summary     Patient Name: Meagan Carpenter DOB: 09/13/1996 MRN: [id number]  Date of admission: 04/13/2018 Delivering MD: Vena AustriaAndreas Damika Harmon, MD  Date of Delivery: 04/13/2018  Date of discharge: 04/15/2018  Admitting diagnosis: induction Intrauterine pregnancy: 5710w2d     Secondary diagnosis: None     Discharge diagnosis: Term Pregnancy Delivered, Post partum hemorrhage                         Hospital course:  Onset of Labor With Vaginal Delivery     22 y.o. yo [phone] at 8210w2d was admitted in Active Labor on 04/13/2018. Patient had an uncomplicated labor course as follows:  Membrane Rupture Time/Date: 5:45 PM ,04/13/2018   Intrapartum Procedures: Episiotomy: None [1]                                         Lacerations:  None [1]  Patient had a delivery of a Viable infant. 04/13/2018  Information for the patient's newborn:  Gillie Mannerstchison, Girl Summer [[id number]][[id number]]  Delivery Method: Vag-Spont    Pateint had an uncomplicated postpartum course.  She is ambulating, tolerating a regular diet, passing flatus, and urinating well. Patient is discharged home in stable condition on 04/15/18.                                                                  Post partum procedures:postpartum tubal ligation  Complications: Hemorrhage>54300mL  Physical exam on 04/15/2018: Vitals:   04/14/18 2005 04/14/18 2317 04/15/18 0326 04/15/18 0733  BP: 129/73 117/77 116/67 117/84  Pulse: (!) 56 (!) 58 (!) 49 (!) 50  Resp: 18 18 16 18   Temp: 98.4 F (36.9 C) 98.2 F (36.8 C) 97.8 F (36.6 C) 97.8 F (36.6 C)  TempSrc: Oral Oral Oral   SpO2: 99% 99% 98% 99%  Weight:      Height:       General: alert and no distress Lochia: appropriate Uterine Fundus: firm Incision: Healing well with no significant drainage DVT Evaluation: No evidence of DVT seen on physical exam.  Labs: Lab Results  Component Value Date   WBC 12.5 (H) 04/14/2018   HGB 8.5 (L) 04/14/2018   HCT 26.3 (L) 04/14/2018   MCV  77.8 (L) 04/14/2018   PLT 195 04/14/2018   CMP Latest Ref Rng & Units 08/30/2017  Glucose 65 - 99 mg/dL 93  BUN 6 - 20 mg/dL 6  Creatinine 1.300.57 - 8.651.00 mg/dL 7.84(O0.56(L)  Sodium 962134 - 952144 mmol/L 141  Potassium 3.5 - 5.2 mmol/L 4.4  Chloride 96 - 106 mmol/L 105  CO2 20 - 29 mmol/L 22  Calcium 8.7 - 10.2 mg/dL 9.1  Total Protein 6.0 - 8.5 g/dL 6.7  Total Bilirubin 0.0 - 1.2 mg/dL 0.2  Alkaline Phos 39 - 117 IU/L 60  AST 0 - 40 IU/L 15  ALT 0 - 32 IU/L 8    Discharge instruction: per After Visit Summary.  Medications:  Allergies as of 04/15/2018   No Known Allergies     Medication List    TAKE these medications   calcium carbonate 500 MG chewable tablet Commonly  known as:  TUMS - dosed in mg elemental calcium Chew 1 tablet by mouth 3 (three) times daily as needed for indigestion or heartburn.   flintstones complete 60 MG chewable tablet Chew 2 tablets by mouth daily.   HYDROcodone-acetaminophen 5-325 MG tablet Commonly known as:  NORCO/VICODIN Take 2 tablets by mouth every 6 (six) hours as needed for severe pain (pain score >7/10).   ibuprofen 600 MG tablet Commonly known as:  ADVIL,MOTRIN Take 1 tablet (600 mg total) by mouth every 6 (six) hours.   omeprazole 20 MG capsule Commonly known as:  PRILOSEC Take 20 mg by mouth daily as needed (For heatrburn.).       Diet: routine diet  Activity: Advance as tolerated. Pelvic rest for 6 weeks.   Outpatient follow up: Follow-up Information    Conard Novak, MD Follow up in 1 week(s).   Specialty:  Obstetrics and Gynecology Why:  postpartum depression check Contact information: [address] [place] [address] [phone]             Postpartum contraception: Tubal Ligation Rhogam Given postpartum: no Rubella vaccine given postpartum: no Varicella vaccine given postpartum: no TDaP given antepartum or postpartum: No  Newborn Data: Live born female  Birth Weight:   APGAR: 8,   Newborn  Delivery   Birth date/time:  04/13/2018 20:34:00 Delivery type:  Vaginal, Spontaneous      Baby Feeding: Bottle  Disposition:home with mother  SIGNED: Vena Austria, MD 04/15/2018 12:54 PM

## 2018-04-13 NOTE — Anesthesia Preprocedure Evaluation (Signed)
Anesthesia Evaluation  Patient identified by MRN, date of birth, ID band Patient awake    Reviewed: Allergy & Precautions, H&P , Patient's Chart, lab work & pertinent test results  History of Anesthesia Complications Negative for: history of anesthetic complications  Airway Mallampati: II   Neck ROM: full    Dental  (+) Teeth Intact   Pulmonary former smoker,           Cardiovascular hypertension,  Rhythm:regular     Neuro/Psych  Headaches,    GI/Hepatic   Endo/Other    Renal/GU      Musculoskeletal   Abdominal   Peds  Hematology  (+) anemia ,   Anesthesia Other Findings   Reproductive/Obstetrics (+) Pregnancy                             Anesthesia Physical Anesthesia Plan  ASA: II  Anesthesia Plan: Epidural   Post-op Pain Management:    Induction:   PONV Risk Score and Plan:   Airway Management Planned:   Additional Equipment:   Intra-op Plan:   Post-operative Plan:   Informed Consent: I have reviewed the patients History and Physical, chart, labs and discussed the procedure including the risks, benefits and alternatives for the proposed anesthesia with the patient or authorized representative who has indicated his/her understanding and acceptance.     Plan Discussed with: Anesthesiologist  Anesthesia Plan Comments:         Anesthesia Quick Evaluation

## 2018-04-13 NOTE — H&P (Signed)
History and Physical  Meagan Carpenter is an 22 y.o. female.  HPI: patient presents today for an elective induction at term.  pregnancy#4 Problems (from 07/12/17 to present)    Problem Noted Resolved   Preterm labor in third trimester 03/11/2018 by Conard NovakJackson, Stephen D, MD No   Anemia affecting pregnancy in third trimester 01/24/2018 by Oswaldo ConroySchmid, Jacelyn Y, CNM No   Supervision of high risk pregnancy, antepartum, first trimester 08/22/2017 by Natale MilchSchuman, Lakeyn Dokken R, MD No   Overview Addendum 09/30/2017  4:48 PM by Oswaldo ConroySchmid, Jacelyn Y, CNM    Clinic [facility] Prenatal Labs  Dating L=7 Blood type: O Positive  Genetic Screen NIPS: Negative, it's a girl! Antibody: Negative  Anatomic US  Rubella: Immune  Varicella: Immune  GTT Early:               Third trimester:  RPR: NR  Rhogam  HBsAg: Negative  TDaP vaccine                       Flu Shot: HIV: NR  Baby Food                                GBS:   Contraception  Pap: 08/22/2017 NILM/HPV Negative  CBB     CS/VBAC    Support Person              Hx of preeclampsia, prior pregnancy, currently pregnant, third trimester 08/22/2017 by Natale MilchSchuman, Derwin Reddy R, MD No       Past Medical History:  Diagnosis Date  . Chlamydia   . Headache(784.0)   . Pre-eclampsia   . UTI (lower urinary tract infection)     Past Surgical History:  Procedure Laterality Date  . ELBOW FRACTURE SURGERY  2012    Family History  Problem Relation Age of Onset  . Heart failure Paternal Grandmother        pacemaker  . Hyperlipidemia Paternal Grandmother   . Hypertension Paternal Grandmother   . Cancer Paternal Grandmother [phone]       great grandmother  . Migraines Paternal Grandmother   . Stroke Paternal Grandfather   . ADD / ADHD Father   . ADD / ADHD Brother   . Autism Other        paternal HaitiGreat Grandmother & Paternal Randie HeinzGreat Aunt had Autism    Social History:  reports that she quit smoking about 8 years ago. Her smoking use included cigarettes. She has never  used smokeless tobacco. She reports that she drank alcohol. She reports that she does not use drugs.  Allergies: No Known Allergies  Medications: I have reviewed the patient's current medications.  No results found for this or any previous visit (from the past 48 hour(s)).  No results found.  ROS Blood pressure 127/80, pulse 78, temperature 98.4 F (36.9 C), temperature source Oral, resp. rate 16, height 5\' 5"  (1.651 m), weight 162 lb (73.5 kg), last menstrual period 07/12/2017. Physical Exam  Assessment/Plan: Will begin induction with pitocin.  GBS negative.  Epidural when she desires.   Meagan Carpenter 04/13/2018, 2:46 PM

## 2018-04-13 NOTE — Anesthesia Procedure Notes (Signed)
Epidural  Start time: 04/13/2018 4:48 PM End time: 04/13/2018 5:02 PM  Staffing Anesthesiologist: Piscitello, Cleda MccreedyJoseph K, MD Resident/CRNA: Irving BurtonBachich, Erisha Paugh, CRNA Performed: resident/CRNA   Preanesthetic Checklist Completed: patient identified, site marked, surgical consent, pre-op evaluation, IV checked, risks and benefits discussed and monitors and equipment checked  Epidural Patient position: sitting Prep: ChloraPrep and site prepped and draped Patient monitoring: heart rate, continuous pulse ox and blood pressure Approach: midline Location: L3-L4 Injection technique: LOR air  Needle:  Needle type: Tuohy  Needle gauge: 17 G Needle length: 9 cm Needle insertion depth: 5 cm Catheter type: closed end flexible Catheter size: 19 Gauge Catheter at skin depth: 10 cm Test dose: negative and 1.5% lidocaine with Epi 1:200 K  Assessment Events: blood not aspirated, injection not painful, no injection resistance, negative IV test and no paresthesia  Additional Notes Reason for block:procedure for pain

## 2018-04-13 NOTE — Discharge Instructions (Signed)
Vaginal Delivery, Care After °Refer to this sheet in the next few weeks. These instructions provide you with information about caring for yourself after vaginal delivery. Your health care provider may also give you more specific instructions. Your treatment has been planned according to current medical practices, but problems sometimes occur. Call your health care provider if you have any problems or questions. °What can I expect after the procedure? °After vaginal delivery, it is common to have: °· Some bleeding from your vagina. °· Soreness in your abdomen, your vagina, and the area of skin between your vaginal opening and your anus (perineum). °· Pelvic cramps. °· Fatigue. ° °Follow these instructions at home: °Medicines °· Take over-the-counter and prescription medicines only as told by your health care provider. °· If you were prescribed an antibiotic medicine, take it as told by your health care provider. Do not stop taking the antibiotic until it is finished. °Driving ° °· Do not drive or operate heavy machinery while taking prescription pain medicine. °· Do not drive for 24 hours if you received a sedative. °Lifestyle °· Do not drink alcohol. This is especially important if you are breastfeeding or taking medicine to relieve pain. °· Do not use tobacco products, including cigarettes, chewing tobacco, or e-cigarettes. If you need help quitting, ask your health care provider. °Eating and drinking °· Drink at least 8 eight-ounce glasses of water every day unless you are told not to by your health care provider. If you choose to breastfeed your baby, you may need to drink more water than this. °· Eat high-fiber foods every day. These foods may help prevent or relieve constipation. High-fiber foods include: °? Whole grain cereals and breads. °? Brown rice. °? Beans. °? Fresh fruits and vegetables. °Activity °· Return to your normal activities as told by your health care provider. Ask your health care provider  what activities are safe for you. °· Rest as much as possible. Try to rest or take a nap when your baby is sleeping. °· Do not lift anything that is heavier than your baby or 10 lb (4.5 kg) until your health care provider says that it is safe. °· Talk with your health care provider about when you can engage in sexual activity. This may depend on your: °? Risk of infection. °? Rate of healing. °? Comfort and desire to engage in sexual activity. °Vaginal Care °· If you have an episiotomy or a vaginal tear, check the area every day for signs of infection. Check for: °? More redness, swelling, or pain. °? More fluid or blood. °? Warmth. °? Pus or a bad smell. °· Do not use tampons or douches until your health care provider says this is safe. °· Watch for any blood clots that may pass from your vagina. These may look like clumps of dark red, brown, or black discharge. °General instructions °· Keep your perineum clean and dry as told by your health care provider. °· Wear loose, comfortable clothing. °· Wipe from front to back when you use the toilet. °· Ask your health care provider if you can shower or take a bath. If you had an episiotomy or a perineal tear during labor and delivery, your health care provider may tell you not to take baths for a certain length of time. °· Wear a bra that supports your breasts and fits you well. °· If possible, have someone help you with household activities and help care for your baby for at least a few days after   you leave the hospital. °· Keep all follow-up visits for you and your baby as told by your health care provider. This is important. °Contact a health care provider if: °· You have: °? Vaginal discharge that has a bad smell. °? Difficulty urinating. °? Pain when urinating. °? A sudden increase or decrease in the frequency of your bowel movements. °? More redness, swelling, or pain around your episiotomy or vaginal tear. °? More fluid or blood coming from your episiotomy or  vaginal tear. °? Pus or a bad smell coming from your episiotomy or vaginal tear. °? A fever. °? A rash. °? Little or no interest in activities you used to enjoy. °? Questions about caring for yourself or your baby. °· Your episiotomy or vaginal tear feels warm to the touch. °· Your episiotomy or vaginal tear is separating or does not appear to be healing. °· Your breasts are painful, hard, or turn red. °· You feel unusually sad or worried. °· You feel nauseous or you vomit. °· You pass large blood clots from your vagina. If you pass a blood clot from your vagina, save it to show to your health care provider. Do not flush blood clots down the toilet without having your health care provider look at them. °· You urinate more than usual. °· You are dizzy or light-headed. °· You have not breastfed at all and you have not had a menstrual period for 12 weeks after delivery. °· You have stopped breastfeeding and you have not had a menstrual period for 12 weeks after you stopped breastfeeding. °Get help right away if: °· You have: °? Pain that does not go away or does not get better with medicine. °? Chest pain. °? Difficulty breathing. °? Blurred vision or spots in your vision. °? Thoughts about hurting yourself or your baby. °· You develop pain in your abdomen or in one of your legs. °· You develop a severe headache. °· You faint. °· You bleed from your vagina so much that you fill two sanitary pads in one hour. °This information is not intended to replace advice given to you by your health care provider. Make sure you discuss any questions you have with your health care provider. °Document Released: 09/17/2000 Document Revised: 03/03/2016 Document Reviewed: 10/05/2015 °Elsevier Interactive Patient Education © 2018 [employer] ° °

## 2018-04-14 ENCOUNTER — Encounter: Payer: Self-pay | Admitting: *Deleted

## 2018-04-14 ENCOUNTER — Encounter
Admission: EM | Disposition: A | Discharge status: Home / Self Care | Payer: Self-pay | Source: Home / Self Care | Attending: Obstetrics and Gynecology

## 2018-04-14 ENCOUNTER — Inpatient Hospital Stay: Payer: Medicaid Other | Admitting: Anesthesiology

## 2018-04-14 DIAGNOSIS — Z302 Encounter for sterilization: Secondary | ICD-10-CM

## 2018-04-14 HISTORY — PX: TUBAL LIGATION: SHX77

## 2018-04-14 LAB — CBC
HEMATOCRIT: 26.3 % — AB (ref 35.0–47.0)
Hemoglobin: 8.5 g/dL — ABNORMAL LOW (ref 12.0–16.0)
MCH: 25.1 pg — ABNORMAL LOW (ref 26.0–34.0)
MCHC: 32.2 g/dL (ref 32.0–36.0)
MCV: 77.8 fL — ABNORMAL LOW (ref 80.0–100.0)
PLATELETS: 195 10*3/uL (ref 150–440)
RBC: 3.38 MIL/uL — AB (ref 3.80–5.20)
RDW: 16.3 % — AB (ref 11.5–14.5)
WBC: 12.5 10*3/uL — AB (ref 3.6–11.0)

## 2018-04-14 LAB — RPR: RPR Ser Ql: NONREACTIVE

## 2018-04-14 SURGERY — LIGATION, FALLOPIAN TUBE, POSTPARTUM
Anesthesia: General

## 2018-04-14 MED ORDER — DEXAMETHASONE SODIUM PHOSPHATE 10 MG/ML IJ SOLN
INTRAMUSCULAR | Status: DC | PRN
  Administered 2018-04-14: 10 mg via INTRAVENOUS

## 2018-04-14 MED ORDER — FENTANYL CITRATE (PF) 100 MCG/2ML IJ SOLN
25.0000 ug | INTRAMUSCULAR | Status: AC | PRN
  Administered 2018-04-14 (×6): 25 ug via INTRAVENOUS

## 2018-04-14 MED ORDER — HYDROMORPHONE HCL 1 MG/ML IJ SOLN: 1.0000 mg | INTRAMUSCULAR | Status: DC | PRN

## 2018-04-14 MED ORDER — FERROUS SULFATE 325 (65 FE) MG PO TABS
325.0000 mg | ORAL_TABLET | Freq: Two times a day (BID) | ORAL | Status: DC
  Administered 2018-04-14 – 2018-04-15 (×2): 325 mg via ORAL
  Filled 2018-04-14 (×2): qty 1

## 2018-04-14 MED ORDER — ONDANSETRON HCL 4 MG/2ML IJ SOLN: 4.0000 mg | Freq: Once | INTRAMUSCULAR | Status: DC | PRN

## 2018-04-14 MED ORDER — BUPIVACAINE HCL (PF) 0.5 % IJ SOLN
INTRAMUSCULAR | Status: AC
  Filled 2018-04-14: qty 30

## 2018-04-14 MED ORDER — ACETAMINOPHEN NICU IV SYRINGE 10 MG/ML
INTRAVENOUS | Status: AC
  Filled 2018-04-14: qty 1

## 2018-04-14 MED ORDER — ONDANSETRON HCL 4 MG/2ML IJ SOLN
INTRAMUSCULAR | Status: DC | PRN
  Administered 2018-04-14: 4 mg via INTRAVENOUS

## 2018-04-14 MED ORDER — BUPIVACAINE HCL (PF) 0.5 % IJ SOLN
INTRAMUSCULAR | Status: DC | PRN
  Administered 2018-04-14: 3 mL

## 2018-04-14 MED ORDER — FENTANYL CITRATE (PF) 100 MCG/2ML IJ SOLN
INTRAMUSCULAR | Status: AC
  Administered 2018-04-14: 25 ug via INTRAVENOUS
  Filled 2018-04-14: qty 2

## 2018-04-14 MED ORDER — KETOROLAC TROMETHAMINE 30 MG/ML IJ SOLN
INTRAMUSCULAR | Status: DC | PRN
  Administered 2018-04-14: 30 mg via INTRAVENOUS

## 2018-04-14 MED ORDER — HYDROCODONE-ACETAMINOPHEN 5-325 MG PO TABS
1.0000 | ORAL_TABLET | ORAL | Status: DC | PRN
  Administered 2018-04-14 (×2): 1 via ORAL
  Filled 2018-04-14 (×2): qty 1

## 2018-04-14 MED ORDER — ROCURONIUM BROMIDE 100 MG/10ML IV SOLN
INTRAVENOUS | Status: DC | PRN
  Administered 2018-04-14: 5 mg via INTRAVENOUS

## 2018-04-14 MED ORDER — HYDROCODONE-ACETAMINOPHEN 5-325 MG PO TABS
2.0000 | ORAL_TABLET | ORAL | Status: DC | PRN
  Administered 2018-04-15 (×3): 2 via ORAL
  Filled 2018-04-14 (×3): qty 2

## 2018-04-14 MED ORDER — PROPOFOL 10 MG/ML IV BOLUS
INTRAVENOUS | Status: DC | PRN
  Administered 2018-04-14: 140 mg via INTRAVENOUS

## 2018-04-14 MED ORDER — FENTANYL CITRATE (PF) 100 MCG/2ML IJ SOLN
INTRAMUSCULAR | Status: AC
  Filled 2018-04-14: qty 2

## 2018-04-14 MED ORDER — LACTATED RINGERS IV SOLN
INTRAVENOUS | Status: DC
  Administered 2018-04-14: 12:00:00 via INTRAVENOUS

## 2018-04-14 MED ORDER — ACETAMINOPHEN 10 MG/ML IV SOLN
INTRAVENOUS | Status: DC | PRN
  Administered 2018-04-14: 1000 mg via INTRAVENOUS

## 2018-04-14 MED ORDER — LIDOCAINE HCL (CARDIAC) PF 100 MG/5ML IV SOSY
PREFILLED_SYRINGE | INTRAVENOUS | Status: DC | PRN
  Administered 2018-04-14: 100 mg via INTRAVENOUS

## 2018-04-14 MED ORDER — MIDAZOLAM HCL 2 MG/2ML IJ SOLN
INTRAMUSCULAR | Status: DC | PRN
  Administered 2018-04-14: 2 mg via INTRAVENOUS

## 2018-04-14 MED ORDER — MIDAZOLAM HCL 2 MG/2ML IJ SOLN
INTRAMUSCULAR | Status: AC
  Filled 2018-04-14: qty 2

## 2018-04-14 MED ORDER — SUCCINYLCHOLINE CHLORIDE 20 MG/ML IJ SOLN
INTRAMUSCULAR | Status: DC | PRN
  Administered 2018-04-14: 100 mg via INTRAVENOUS

## 2018-04-14 MED ORDER — FENTANYL CITRATE (PF) 100 MCG/2ML IJ SOLN
INTRAMUSCULAR | Status: DC | PRN
  Administered 2018-04-14 (×2): 50 ug via INTRAVENOUS

## 2018-04-14 MED ORDER — KETOROLAC TROMETHAMINE 30 MG/ML IJ SOLN
INTRAMUSCULAR | Status: AC
  Filled 2018-04-14: qty 1

## 2018-04-14 SURGICAL SUPPLY — 28 items
BLADE SURG SZ11 CARB STEEL (BLADE) ×3 IMPLANT
CHLORAPREP W/TINT 26ML (MISCELLANEOUS) ×3 IMPLANT
DERMABOND ADVANCED (GAUZE/BANDAGES/DRESSINGS) ×2
DERMABOND ADVANCED .7 DNX12 (GAUZE/BANDAGES/DRESSINGS) ×1 IMPLANT
DRAPE LAPAROTOMY 77X122 PED (DRAPES) ×3 IMPLANT
ELECT CAUTERY BLADE 6.4 (BLADE) ×3 IMPLANT
ELECT REM PT RETURN 9FT ADLT (ELECTROSURGICAL) ×3
ELECTRODE REM PT RTRN 9FT ADLT (ELECTROSURGICAL) ×1 IMPLANT
GLOVE BIO SURGEON STRL SZ7 (GLOVE) ×9 IMPLANT
GLOVE BIOGEL PI IND STRL 7.5 (GLOVE) ×3 IMPLANT
GLOVE BIOGEL PI INDICATOR 7.5 (GLOVE) ×6
GOWN STRL REUS W/ TWL LRG LVL3 (GOWN DISPOSABLE) ×2 IMPLANT
GOWN STRL REUS W/ TWL XL LVL3 (GOWN DISPOSABLE) ×1 IMPLANT
GOWN STRL REUS W/TWL LRG LVL3 (GOWN DISPOSABLE) ×4
GOWN STRL REUS W/TWL XL LVL3 (GOWN DISPOSABLE) ×2
KIT TURNOVER CYSTO (KITS) ×3 IMPLANT
LABEL OR SOLS (LABEL) ×3 IMPLANT
NEEDLE HYPO 22GX1.5 SAFETY (NEEDLE) ×3 IMPLANT
NS IRRIG 500ML POUR BTL (IV SOLUTION) ×3 IMPLANT
PACK BASIN MINOR ARMC (MISCELLANEOUS) ×3 IMPLANT
SUT MNCRL 4-0 (SUTURE) ×2
SUT MNCRL 4-0 27XMFL (SUTURE) ×1
SUT PLAIN GUT 0 (SUTURE) ×6 IMPLANT
SUT VIC AB 2-0 UR6 27 (SUTURE) ×3 IMPLANT
SUT VIC AB 3-0 SH 27 (SUTURE) ×2
SUT VIC AB 3-0 SH 27X BRD (SUTURE) ×1 IMPLANT
SUTURE MNCRL 4-0 27XMF (SUTURE) ×1 IMPLANT
SYR 10ML LL (SYRINGE) ×3 IMPLANT

## 2018-04-14 NOTE — Progress Notes (Signed)
Patient tearful, states she just feels so alone. Sister of the FOB at bedside.

## 2018-04-14 NOTE — Progress Notes (Signed)
FOB has arrived

## 2018-04-14 NOTE — Plan of Care (Signed)
Vs stable; up ad lib; tolerated regular diet until about midnight then NPO; will be added onto surgery schedule for today; having BTL; CHG wipes done night before surgery and morning of surgery already; taking motrin for pain control and has heating pad for back pain; breastfeeds and does need assistance; has nipple shield if needed and did have to use; needs to sign informed consent (no order to "get consent for...." from MD yet); will need to sign consent prior to surgery; 30 day medicaid papers have been signed and [facility]

## 2018-04-14 NOTE — Lactation Note (Signed)
This note was copied from a baby's chart. Lactation Consultation Note  Patient Name: Meagan Carpenter [id number]'s Date: 04/14/2018     Maternal Data    Feeding    LATCH Score                   Interventions    Lactation Tools Discussed/Used     Consult Status  Mom states that she has pumped and breast and formula fed her older two children and plans to do the same with this baby. Mom started nursing after delivery with a nipple shield, but LC wasn't able to access why. Mom stated that baby had a "hard" time trying to latch onto her breast. Mom is comfortable pumping for now. LC will check on mom after her BTL.     Burnadette PeterJaniya M Sanjith Siwek 04/14/2018, 10:03 AM

## 2018-04-14 NOTE — Progress Notes (Signed)
Patient has L ear piercing that she states CAN NOT come out for surgery she has had it in for years, OR notified. Glasses removed and left at bedside.

## 2018-04-14 NOTE — Anesthesia Post-op Follow-up Note (Signed)
Anesthesia QCDR form completed.        

## 2018-04-14 NOTE — Anesthesia Procedure Notes (Signed)
Procedure Name: Intubation Performed by: Lasaundra Riche, CRNA Pre-anesthesia Checklist: Patient identified, Patient being monitored, Timeout performed, Emergency Drugs available and Suction available Patient Re-evaluated:Patient Re-evaluated prior to induction Oxygen Delivery Method: Circle system utilized Preoxygenation: Pre-oxygenation with 100% oxygen Induction Type: IV induction, Rapid sequence and Cricoid Pressure applied Laryngoscope Size: Mac and 3 Grade View: Grade I Tube type: Oral Tube size: 7.0 mm Number of attempts: 1 Airway Equipment and Method: Stylet Placement Confirmation: ETT inserted through vocal cords under direct vision,  positive ETCO2 and breath sounds checked- equal and bilateral Secured at: 21 cm Tube secured with: Tape Dental Injury: Teeth and Oropharynx as per pre-operative assessment        

## 2018-04-14 NOTE — Anesthesia Postprocedure Evaluation (Signed)
Anesthesia Post Note  Patient: Meagan Carpenter  Procedure(s) Performed: AN AD HOC LABOR EPIDURAL  Patient location during evaluation: Mother Baby Anesthesia Type: Epidural Level of consciousness: awake and alert Pain management: pain level controlled Vital Signs Assessment: post-procedure vital signs reviewed and stable Respiratory status: spontaneous breathing, nonlabored ventilation and respiratory function stable Cardiovascular status: stable Postop Assessment: no headache, no backache and epidural receding Anesthetic complications: no     Last Vitals:  Vitals:   04/14/18 0653 04/14/18 0746  BP: 128/89 130/89  Pulse: (!) 51 (!) 52  Resp: 20 18  Temp: 36.7 C 36.8 C  SpO2: 99% 99%    Last Pain:  Vitals:   04/14/18 0746  TempSrc: Oral  PainSc:                  Starling Mannsurtis,  Makhayla Mcmurry A

## 2018-04-14 NOTE — Progress Notes (Signed)
Called by nursing to evaluate patient for chest tightness. She has been having a difficult time since delivery because of her social dynamics with her partner. Her partner has been frequently leaving the unit. She says that he tends to run away from situations that are overwhelming from him. She says that she wishes she could run away too but she can't. She reports that with her previous deliveries the other father of her children have been more present ans supportive. She knows that she has a lot of support  Right now with her significant other's  sister and his mother. However she does currently feel isolated. She has a history of postpartum depression. She is concerned that she may feel the same way, but she thinks her mood currently is more related to her situation than to depression. She does not recall the name of the medication she took in the past. She does not currently see a counselor or therapist. I gave her the office phone number for Abel PrestoMarilynn Steele so that she could call her about being connected to a therapist. She was supposed to have a tubal ligation this afternoon. She does not want to have the tubal if she still has chest pressure or tightness. All vital signs have been normal. Patient was resting very comfortably in bed when I spoke with her.  Adelene Idlerhristanna Saquan Furtick MD [facility], [facility] 04/14/18 8:52 AM

## 2018-04-14 NOTE — Progress Notes (Signed)
22 y.o. [id number]  with undesired fertility, desires permanent sterilization.  Other reversible forms of contraception were discussed with patient; she declines all other modalities. Permanent nature of as well as associated risks of the procedure discussed with patient including but not limited to: risk of regret, permanence of method, bleeding, infection, injury to surrounding organs and need for additional procedures.  Failure risk of 3-5 per 1,000 procedures each year with increased risk of ectopic gestation if pregnancy occurs was also discussed with patient.  The question of a permanent form of sterilization has been discussed with her by me and several other providers. She has maintained a 100% desire to proceed. Will take to the operating room to perform.    Thomasene MohairStephen Janziel Hockett, MD, Merlinda FrederickFACOG [facility], [facility] 04/14/2018 12:38 PM

## 2018-04-14 NOTE — Progress Notes (Addendum)
PPD#1 SVD Subjective:  Appears fatigued, but is alert and cooperative.  Pain control is adequate. Voiding without difficulty. Currently NPO for scheduled surgery.  Ambulating well. She felt very anxious earlier this morning, but says that she is feeling better now.  Objective:   Blood pressure 130/89, pulse (!) 52, temperature 98.2 F (36.8 C), temperature source Oral, resp. rate 18, height 5\' 5"  (1.651 m), weight 162 lb (73.5 kg), last menstrual period 07/12/2017, SpO2 99 %, currently breastfeeding.  General: NAD Pulmonary: no increased work of breathing Abdomen: non-distended, non-tender Uterus:  fundus firm; lochia appropriate Extremities: no edema, no erythema, no tenderness, no signs of DVT  Results for orders placed or performed during the hospital encounter of 04/13/18 (from the past 72 hour(s))  CBC     Status: Abnormal   Collection Time: 04/13/18  4:17 PM  Result Value Ref Range   WBC 12.0 (H) 3.6 - 11.0 K/uL   RBC 3.70 (L) 3.80 - 5.20 MIL/uL   Hemoglobin 9.3 (L) 12.0 - 16.0 g/dL   HCT 16.1 (L) 09.6 - 04.5 %   MCV 77.5 (L) 80.0 - 100.0 fL   MCH 25.1 (L) 26.0 - 34.0 pg   MCHC 32.4 32.0 - 36.0 g/dL   RDW 40.9 (H) 81.1 - 91.4 %   Platelets 247 150 - 440 K/uL    Comment: Performed at [facility], [address] [place], [place] [address]  RPR     Status: None   Collection Time: 04/13/18  4:17 PM  Result Value Ref Range   RPR Ser Ql Non Reactive Non Reactive    Comment: (NOTE) Performed At: [facility] [address] [place], [place] [id number] Jolene Schimke MD QI:[id number]   Type and screen     Status: None   Collection Time: 04/13/18  4:17 PM  Result Value Ref Range   ABO/RH(D) O POS    Antibody Screen NEG    Sample Expiration      04/16/2018 Performed at [facility], [address] [place], [place] [address]   Chlamydia/NGC rt PCR [facility] only)     Status: None   Collection Time: 04/13/18  6:42 PM  Result Value Ref Range   Specimen source GC/Chlam ENDOCERVICAL    Chlamydia Tr NOT DETECTED NOT DETECTED   N gonorrhoeae NOT DETECTED NOT DETECTED    Comment: (NOTE) 100  This methodology has not been evaluated in pregnant women or in 200  patients with a history of hysterectomy. 300 400  This methodology will not be performed on patients less than 60  years of age. Performed at [facility], [address] [place], [place] [address]   CBC     Status: Abnormal   Collection Time: 04/14/18  6:55 AM  Result Value Ref Range   WBC 12.5 (H) 3.6 - 11.0 K/uL   RBC 3.38 (L) 3.80 - 5.20 MIL/uL   Hemoglobin 8.5 (L) 12.0 - 16.0 g/dL   HCT 53.6 (L) 64.4 - 03.4 %   MCV 77.8 (L) 80.0 - 100.0 fL   MCH 25.1 (L) 26.0 - 34.0 pg   MCHC 32.2 32.0 - 36.0 g/dL   RDW 74.2 (H) 59.5 - 63.8 %   Platelets 195 150 - 440 K/uL    Comment: Performed at [facility], [address], [place], [place] [address]    Assessment:   22 y.o. [phone] postpartum day # 1 in good condition.  Plan:   1) Acute blood loss anemia -  hemodynamically stable and asymptomatic - PO ferrous sulfate  2) Blood Type --/--/O POS (07/11 1617) /   3) Rubella 1.12 (12/21 1548) / Varicella immune / TDAP status: given 02/08/2018.  4) Breast and formula feeding.  5) Contraception: Desires BTL. Discussed at length that tubal ligation is permanent sterilization; patient verbalizes that she is sure that she wants to proceed with surgery today and does not desire future fertility.  6) Disposition: continue postpartum care, BTL scheduled for early afternoon. Social work consult for anxiety, history of postpartum depression.  Marcelyn BruinsJacelyn Omah Dewalt, CNM 04/14/2018
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
## 2018-04-14 NOTE — Clinical Social Work Note (Signed)
CSW consulted by nursing because patient is exhibiting acute anxiety symptoms and has a history of what is being documented as "severe" postpartum depression. CSW has advised nursing to obtain an order for psychiatry. York SpanielMonica Yumalay Circle MSW,LCSW [phone]

## 2018-04-14 NOTE — Op Note (Signed)
Operative Note Postpartum Tubal Ligation  Pre-Op Diagnosis: multiparity, desires permanent sterilization  Post-Op Diagnosis: multiparity, desires permanent sterilization  Procedures:  Postpartum tubal ligation via Pomeroy method  Primary Surgeon: Thomasene MohairStephen Patryck Kilgore, MD  EBL: 25 ml   IVF: 500 mL   Specimens: portion of right and left fallopian tubes  Drains: None  Complications: None   Disposition: PACU   Condition: Stable   Findings: normal appearing bilateral fallopian tubes  Indication: The patient is a 22 y.o. [id number] who is postpartum day 1 status post spontaneous vaginal delivery.  She has been counseled extensively regarding risks, benefits, and alternatives to tubal ligation, including non-permanent forms of contraception that are equivalent in efficacy with potentially better side effects.  She has been advised that there is a failure rate of 3-5 in every 1,000 tubal ligations per year with an increased risk of ectopic pregnancy should pregnancy occur.   Procedure Summary:  The patient was taken to the operating room where general anesthesia was administered and found to be adequate. After timeout was called a small transverse, infraumbilical incision was made with the scalpel. The incision was carried down through the fascia until the peritoneum was identified and entered. The peritoneum was noted to be free of any adhesions and the incision was then extended.  The patient's left fallopian tube was identified, brought incision, and grasped with a Babcock clamp. The tube was then followed out to the fimbria. The Babcock clamp was then used to grasp the tube approximately 4 cm from the cornual region. A 3 cm segment of tube was then ligated with the 2 free ties of plain gut, and excised. Good hemostasis was noted and the tube was returned to the abdomen. The right fallopian tube was then ligated, and a 3 cm segment excised in a similar fashion. Excellent hemostasis was noted,  and the tube returned to the abdomen.  The peritoneum and fascia were closed in a single layer using 2-0 Vicryl. The skin was closed in a subcuticular fashion using 4-0 monocryl. The closure was also closed with Dermabond.  The patient tolerated the procedure well. Sponge, lap, and needle counts were correct 2. The patient was taken to the recovery room in stable condition.   Sponge, lap, needle, and instrument counts were correct x 2.  VTE prophylaxis: SCDs. Antibiotic prophylaxis: none indicated. The patient tolerated the procedure well and was taken to the PACU in stable condition.   Thomasene MohairStephen Zani Kyllonen, MD 04/14/2018 1:53 PM

## 2018-04-14 NOTE — Progress Notes (Signed)
Patient back from OR.

## 2018-04-14 NOTE — Anesthesia Preprocedure Evaluation (Signed)
Anesthesia Evaluation  Patient identified by MRN, date of birth, ID band Patient awake    Reviewed: Allergy & Precautions, H&P , NPO status , Patient's Chart, lab work & pertinent test results  History of Anesthesia Complications Negative for: history of anesthetic complications  Airway Mallampati: II   Neck ROM: full    Dental  (+) Teeth Intact   Pulmonary former smoker,           Cardiovascular hypertension,  Rhythm:regular     Neuro/Psych  Headaches,    GI/Hepatic negative GI ROS, Neg liver ROS,   Endo/Other  negative endocrine ROS  Renal/GU negative Renal ROS     Musculoskeletal negative musculoskeletal ROS (+)   Abdominal   Peds  Hematology  (+) anemia ,   Anesthesia Other Findings   Reproductive/Obstetrics                             Anesthesia Physical  Anesthesia Plan  ASA: II  Anesthesia Plan: General   Post-op Pain Management:    Induction: Intravenous, Rapid sequence and Cricoid pressure planned  PONV Risk Score and Plan:   Airway Management Planned: Oral ETT  Additional Equipment:   Intra-op Plan:   Post-operative Plan: Extubation in OR  Informed Consent: I have reviewed the patients History and Physical, chart, labs and discussed the procedure including the risks, benefits and alternatives for the proposed anesthesia with the patient or authorized representative who has indicated his/her understanding and acceptance.     Plan Discussed with: Anesthesiologist, CRNA and Surgeon  Anesthesia Plan Comments:         Anesthesia Quick Evaluation

## 2018-04-14 NOTE — Progress Notes (Signed)
Patient to OR for BTL 

## 2018-04-14 NOTE — Progress Notes (Signed)
There is a note on the back of the patients feeding log that reads hating life.Meagan Carpenter..Meagan Carpenter

## 2018-04-14 NOTE — Anesthesia Postprocedure Evaluation (Signed)
Anesthesia Post Note  Patient: Meagan Carpenter  Procedure(s) Performed: POST PARTUM TUBAL LIGATION (N/A )  Patient location during evaluation: PACU Anesthesia Type: General Level of consciousness: awake and alert and oriented Pain management: pain level controlled Vital Signs Assessment: post-procedure vital signs reviewed and stable Respiratory status: spontaneous breathing Cardiovascular status: blood pressure returned to baseline Anesthetic complications: no     Last Vitals:  Vitals:   04/14/18 0746 04/14/18 1221  BP: 130/89 131/83  Pulse: (!) 52 (!) 57  Resp: 18 20  Temp: 36.8 C 36.6 C  SpO2: 99% 99%    Last Pain:  Vitals:   04/14/18 1221  TempSrc: Temporal  PainSc: 5                  [name]

## 2018-04-14 NOTE — Transfer of Care (Signed)
Immediate Anesthesia Transfer of Care Note  Patient: Meagan Carpenter  Procedure(s) Performed: POST PARTUM TUBAL LIGATION (N/A )  Patient Location: PACU  Anesthesia Type:General  Level of Consciousness: awake, alert  and oriented  Airway & Oxygen Therapy: Patient Spontanous Breathing and Patient connected to face mask oxygen  Post-op Assessment: Report given to RN and Post -op Vital signs reviewed and stable  Post vital signs: Reviewed and stable  Last Vitals:  Vitals Value Taken Time  BP 126/77 04/14/2018  1:50 PM  Temp    Pulse 66 04/14/2018  1:51 PM  Resp 12 04/14/2018  1:51 PM  SpO2 100 % 04/14/2018  1:51 PM  Vitals shown include unvalidated device data.  Last Pain:  Vitals:   04/14/18 1221  TempSrc: Temporal  PainSc: 5          Complications: No apparent anesthesia complications

## 2018-04-15 ENCOUNTER — Encounter: Payer: Self-pay | Admitting: Obstetrics and Gynecology

## 2018-04-15 DIAGNOSIS — O99345 Other mental disorders complicating the puerperium: Secondary | ICD-10-CM

## 2018-04-15 DIAGNOSIS — F339 Major depressive disorder, recurrent, unspecified: Secondary | ICD-10-CM

## 2018-04-15 LAB — URINE DRUG SCREEN, QUALITATIVE (ARMC ONLY)
Amphetamines, Ur Screen: NOT DETECTED
Benzodiazepine, Ur Scrn: NOT DETECTED
COCAINE METABOLITE, UR ~~LOC~~: NOT DETECTED
Cannabinoid 50 Ng, Ur ~~LOC~~: NOT DETECTED
MDMA (ECSTASY) UR SCREEN: NOT DETECTED
METHADONE SCREEN, URINE: NOT DETECTED
OPIATE, UR SCREEN: POSITIVE — AB
Phencyclidine (PCP) Ur S: NOT DETECTED
TRICYCLIC, UR SCREEN: NOT DETECTED

## 2018-04-15 MED ORDER — IBUPROFEN 600 MG PO TABS: 600.0000 mg | ORAL_TABLET | Freq: Four times a day (QID) | ORAL | 0 refills | Status: AC

## 2018-04-15 MED ORDER — HYDROCODONE-ACETAMINOPHEN 5-325 MG PO TABS: 2.0000 | ORAL_TABLET | Freq: Four times a day (QID) | ORAL | 0 refills | Status: AC | PRN

## 2018-04-15 MED ORDER — IBUPROFEN 600 MG PO TABS
600.0000 mg | ORAL_TABLET | Freq: Four times a day (QID) | ORAL | Status: DC
  Administered 2018-04-15: 600 mg via ORAL
  Filled 2018-04-15 (×2): qty 1

## 2018-04-15 NOTE — Progress Notes (Signed)
Spoke with Dr Jennet MaduroPucilowska. Psych consult was called and MD is aware, plans to see pt this morning.

## 2018-04-15 NOTE — Plan of Care (Signed)
Vs stable; up ad lib; showered this shift twice (once was for hot water to be on her back for pain control); tolerating regular diet; taking norco and motrin for pain control; formula feeding baby now; pt did NOT observe pt tearful up til this point in the shift; father of baby has slept in recliner chair since around 2100 on 04-14-18; pt has been appropriate with baby this shift; probable discharge later today; social work and psych consults for 04-15-18 and need to be done PRIOR to discharge

## 2018-04-15 NOTE — Care Management (Signed)
RNCM has consulted on patient for follow up appointment needs at request of the Physician. Medical staff expresses concern about the follow up appointment being made if it is left up to the patient. Patient tells me her preferred follow up [facility] as she is not content with the provider for her other children. As it is the weekend I will personally follow through on Monday in making the follow up for her newborn. I have also asked that the patient call herself on Monday am but if the patient does not follow through. I will call the patient and let her know of the appointment time. It was stressed that the appointment be as soon as possible and should be for Monday as this is a newborn check up. I have also stressed the importance of attending the follow up to the patient. Her response was "I guess it has to be done" but none the less has agreed to attend.

## 2018-04-15 NOTE — Progress Notes (Signed)
FOB at bedside since shift change this AM. Drowsy, falling asleep while sitting up. Became agitated when asked to step out for pt confidentiality, stating "it better not take all day!". Later, was noted sleeping (snoring) with infant in his arms while sitting in recliner with a blanket covering his head and the infant. This nurse removed infant and placed in bassinet. Discussed risks of covering baby's face, breathing his expelled CO2, risks of SIDS. Education provided on SIDS and also Purple cry video provided/watched. Mother answers questions appropriately, receptive to education. FOB pulled blanket back over his head and did not respond any further. Will continue to monitor. SW consult pending.

## 2018-04-15 NOTE — Progress Notes (Addendum)
Subjective:  Doing well no concerns.  Some pain around tubal site.  Lochia appropriate.  Objective:   Blood pressure 117/84, pulse (!) 50, temperature 97.8 F (36.6 C), resp. rate 18, height '5\' 5"'  (1.651 m), weight 162 lb (73.5 kg), last menstrual period 07/12/2017, SpO2 99 %, not currently breastfeeding.  General: NAD Pulmonary: no increased work of breathing Abdomen: non-distended, non-tender, fundus firm at level of umbilicus, incision D/C/I Extremities: no edema, no erythema, no tenderness Psych: Mood appropriate, affect flat  Results for orders placed or performed during the hospital encounter of 04/13/18 (from the past 72 hour(s))  CBC     Status: Abnormal   Collection Time: 04/13/18  4:17 PM  Result Value Ref Range   WBC 12.0 (H) 3.6 - 11.0 K/uL   RBC 3.70 (L) 3.80 - 5.20 MIL/uL   Hemoglobin 9.3 (L) 12.0 - 16.0 g/dL   HCT 28.7 (L) 35.0 - 47.0 %   MCV 77.5 (L) 80.0 - 100.0 fL   MCH 25.1 (L) 26.0 - 34.0 pg   MCHC 32.4 32.0 - 36.0 g/dL   RDW 16.3 (H) 11.5 - 14.5 %   Platelets 247 150 - 440 K/uL    Comment: Performed at [facility], [address] [place], [place] [address]  RPR     Status: None   Collection Time: 04/13/18  4:17 PM  Result Value Ref Range   RPR Ser Ql Non Reactive Non Reactive    Comment: (NOTE) Performed At: [facility] [address] [place], [place] [id number] Rush Farmer MD [id number]   Type and screen     Status: None   Collection Time: 04/13/18  4:17 PM  Result Value Ref Range   ABO/RH(D) O POS    Antibody Screen NEG    Sample Expiration      04/16/2018 Performed at [facility], [address] [place], [place] [address]   [place] rt PCR [facility] only)     Status: None   Collection Time: 04/13/18  6:42 PM  Result Value Ref Range   Specimen source GC/Chlam ENDOCERVICAL    Chlamydia Tr NOT DETECTED NOT DETECTED   N gonorrhoeae NOT DETECTED NOT DETECTED    Comment: (NOTE) 100  This methodology has  not been evaluated in pregnant women or in 200  patients with a history of hysterectomy. 300 400  This methodology will not be performed on patients less than 59  years of age. Performed at [facility], [address] [place], [place] [address]   CBC     Status: Abnormal   Collection Time: 04/14/18  6:55 AM  Result Value Ref Range   WBC 12.5 (H) 3.6 - 11.0 K/uL   RBC 3.38 (L) 3.80 - 5.20 MIL/uL   Hemoglobin 8.5 (L) 12.0 - 16.0 g/dL   HCT 26.3 (L) 35.0 - 47.0 %   MCV 77.8 (L) 80.0 - 100.0 fL   MCH 25.1 (L) 26.0 - 34.0 pg   MCHC 32.2 32.0 - 36.0 g/dL   RDW 16.3 (H) 11.5 - 14.5 %   Platelets 195 150 - 440 K/uL    Comment: Performed at [facility], [address] [place],  [address]    Assessment:   22 y.o. [phone] postpartum day # 2 TSVD, POD1 BTL  Plan:    1) Acute blood loss anemia - hemodynamically stable and asymptomatic - po ferrous sulfate  2) Blood Type --/--/O POS (07/11 1617) / Rubella 1.12 (12/21 1548) /  Varicella Immune  3) TDAP status up to date  Immunization History  Administered Date(s) Administered  . Influenza,inj,Quad PF,6+ Mos 08/21/2013, 06/18/2014, 08/06/2015  . MMR 08/05/2014  . Tdap 05/15/2014, 09/17/2015, 02/08/2018    4) Feeding plan bottle  5)  Education given regarding options for contraception, as well as compatibility with breast feeding if applicable.  Patient s/p tubal ligation for contraception.  6) Social/subtance use concerns (partner reportedly on multiple substance only UDS in system from this pregnancy on patient negative) - UDS ordered - Social work and psychiatry consult pending - History of postpartum depression with significant anxiety although per patient report a majority of this seems situational.  She was previously on Zoloft following her last pregnancy  7) Disposition - pending social work and psychiatry consult  Malachy Mood, MD, [place], [place]  [facility] 04/15/2018, 9:32 AM

## 2018-04-15 NOTE — Clinical Social Work Note (Signed)
CSW received consult about multiple issues. CSW will assess when able.  Argentina PonderKaren Martha Mansel Strother, MSW, Theresia MajorsLCSWA [phone]

## 2018-04-15 NOTE — Clinical Social Work Maternal (Signed)
CLINICAL SOCIAL WORK MATERNAL/CHILD NOTE  Patient Details  Name: Meagan Carpenter MRN: [id number] Date of Birth: 08-12-1996  Date:  04/15/2018  Clinical Social Worker Initiating Note:  Santiago Bumpers, MSW, [place] DP Date/Time: Initiated:  04/15/18/1549     Child's Name:  Meagan Carpenter   Biological Parents:  Mother, Father   Need for Interpreter:  None   Reason for Referral:  Behavioral Health Concerns, Other (Comment)(Concerns about FOB behaviors)   Address:  [address] [place] [address]    Phone number:  [phone] (home)     Additional phone number: None  Household Members/Support Persons (HM/SP):   Household Member/Support Person 1, Household Member/Support Person 2, Household Member/Support Person 3   HM/SP Name Relationship DOB or Age  HM/SP -1 FOB [place] FOB Unknown  HM/SP -[place] 4  HM/SP -[place] child 2  HM/SP -4        HM/SP -5        HM/SP -6        HM/SP -7        HM/SP -8          Natural Supports (not living in the home):  Extended Family, Friends   Chiropodist: None   Employment: Unemployed   Type of Work: None   Education:  High school graduate   Homebound arranged:    Museum/gallery curator Resources:  Medicaid   Other Resources:  [facility]   Cultural/Religious Considerations Which May Impact Care:  None  Strengths:  Ability to meet basic needs , Home prepared for child , Pediatrician chosen   Psychotropic Medications:         Pediatrician:    [employer]  Pediatrician List:   [place])  [facility]      Pediatrician Fax Number:    Risk Factors/Current Problems:  Compliance with Treatment , Family/Relationship Issues , Mental Health Concerns    Cognitive State:  Distractible , Poor Insight , Poor Judgement    Mood/Affect:  Animated   CSW Assessment: The CSW met with the  patient at bedside having asked the FOB to leave the room. The patient presented with mild/moderate pressured speech. The CSW introduced herself and role in care. The CSW explained that the FOB's behavior seemed concerning for possible substance use. The patient shared that "he ain't using. My daddy died of an overdose. I wouldn't let him around the babies and me if he was using." The CSW did not share the information provided by the FOB's sister (see progress note from 7/12) to limit discord in the patient's support system. The patient has 2 other children with a different father Meagan Carpenter (4) and Meagan Carpenter (2)) who have been staying with the patient's grandmother for the past month when the patient began having symptoms of possible labor. The patient shared that her children visited yesterday and would return to her care once the patient and the FOB find housing. In the meantime, the patient and the FOB would be living with the FOB's sister.   The CSW inquired about the patient's past depression. The patient admitted that she became depressed after her first child's birth, but that "that pill they Carpenter me made me sick. But my depression went away." The patient does not have a PCP or a therapist/psychiatrist. The CSW advised the patient of resources for such as well as  housing, and asked if she would like a referral. The patient agreed to [facility] [place] involvement. The patient has chosen [facility] for follow up for the infant. The CSW contacted the [facility] to assist with making the appointment as it seems the patient will not make the appointment on her own.  The CSW has no mandated basis to contact CPS. The CSW advised the attending RN to mention to the FOB's sister that she can contact CPS of her own volition if the FOB continues to use substances. As the patient and her baby will discharge into the FOB's sister's home, she has a safe discharge and no barriers to such. The CSW has begun the referral process for  [facility] and [employer] to contact the patient. CSW is signing off. Please consult should additional needs arise.  CSW Plan/Description:  Other Information/Referral to [employer], Perinatal Mood and Anxiety Disorder (PMADs) Education    Zettie Pho, LCSW 04/15/2018, 3:57 PM

## 2018-04-15 NOTE — Consult Note (Signed)
[facility] Face-to-Face Psychiatry Consult   Reason for Consult:  depression Referring Physician:  Dr. Jerene Pitch Patient Identification: Meagan Carpenter MRN:  [id number] Principal Diagnosis: Major depressive disorder, recurrent, with postpartum onset [facility]) Diagnosis:   Patient Active Problem List   Diagnosis Date Noted  . Major depressive disorder, recurrent, with postpartum onset (HCC) [W09.811, F33.9] 04/15/2018    Priority: High  . Labor and delivery, indication for care [O75.9] 04/13/2018  . Preterm uterine contractions in third trimester, antepartum [O47.03] 03/11/2018  . Preterm labor in third trimester [O60.03] 03/11/2018  . Anemia affecting pregnancy in third trimester [O99.013] 01/24/2018  . Nausea and vomiting during pregnancy prior to [redacted] weeks gestation [O21.9] 09/23/2017  . Supervision of high risk pregnancy, antepartum, first trimester [O09.91] 08/22/2017  . Hx of preeclampsia, prior pregnancy, currently pregnant, third trimester [O09.293] 08/22/2017  . Tension headache [G44.209] 03/23/2013  . Migraine headache without aura [G43.009] 03/23/2013    Total Time spent with patient: 1 hour  Subjective:    Identifying data. Meagan Carpenter is a 22 year old female with a history of postpartum depression.  Chief complaints. "I know what depression is, I've been depressed."  History of present illness. Information was obtained from the patient and the chart. The patient delivered a healthy baby girl on 04/13/2018. For the first day or two following delivery, she was quite tearful, made impression of not bonding with the baby and was constantly looking for "support" from her loved ones. With a history of postpartum depression 4.5 year ago, psychiatry was asked to evaluate the patient.   Today, she denies any symptoms of depression, anxiety or psychosis. She no longer cries and has been able to name several supports including father of the baby, his sister and her grandparents. She takes  proper care of her baby but does not breastfeed. She denies alcohol or illicit substance use.   Past psychiatric history. Following her first pregnancy, she felt depressed and was prescribed Zoloft by her OBGYN. She took it only "for a week" as she did not like the way it made her feel. Depression went away shortly. She was never hospitalized. No suicide attempts. Before she got pregnant, she felt she had "anger control" problems and did see mental health professional who diagnised her with depression and suggested medications. She did not feel she were depressed, did not follow up, then became pregnant. She denies depression during pregnancy.   Family psychiatric history. None.  Social history. The chart is ful of information about the boyfriend and the family, including interview with sister-in-law. The patient tells me that herself, boyfriend and the baby will stay with his siter for now. He has 6 weeks of paid paternity leave from [facility] and will be able to find their own place. Once done, she will get two of her older children, who now stay with the patient grandmother, will live with them. The sister had concerns about drug use use by the father of the baby. Some unusual behavior was also observed in the hospital (see notes). The patient explains that the boyfriend does not do well under stress and denies that there is any abuse in their relationship. She also denies that the baby might be put for adoption "this baby stay with me".    Risk to Self: Is patient at risk for suicide?: No Risk to Others:   Prior Inpatient Therapy:   Prior Outpatient Therapy:    Past Medical History:  Past Medical History:  Diagnosis Date  . Chlamydia   .  Headache(784.0)   . Pre-eclampsia   . UTI (lower urinary tract infection)     Past Surgical History:  Procedure Laterality Date  . ELBOW FRACTURE SURGERY  2012  . TUBAL LIGATION N/A 04/14/2018   Procedure: POST PARTUM TUBAL LIGATION;  Surgeon: Conard Novak, MD;  Location: [facility] ORS;  Service: Gynecology;  Laterality: N/A;   Family History:  Family History  Problem Relation Age of Onset  . Heart failure Paternal Grandmother        pacemaker  . Hyperlipidemia Paternal Grandmother   . Hypertension Paternal Grandmother   . Cancer Paternal Grandmother 38       great grandmother  . Migraines Paternal Grandmother   . Stroke Paternal Grandfather   . ADD / ADHD Father   . ADD / ADHD Brother   . Autism Other        paternal [place] Grandmother & Paternal Great Aunt had Autism   Social History:  Social History   Substance and Sexual Activity  Alcohol Use Not Currently  . Alcohol/week: 0.0 oz     Social History   Substance and Sexual Activity  Drug Use No    Social History   Socioeconomic History  . Marital status: Single    Spouse name: Not on file  . Number of children: Not on file  . Years of education: Not on file  . Highest education level: Not on file  Occupational History  . Not on file  Social Needs  . Financial resource strain: Not on file  . Food insecurity:    Worry: Not on file    Inability: Not on file  . Transportation needs:    Medical: Not on file    Non-medical: Not on file  Tobacco Use  . Smoking status: Former Smoker    Types: Cigarettes    Last attempt to quit: 01/01/2010    Years since quitting: 8.2  . Smokeless tobacco: Never Used  Substance and Sexual Activity  . Alcohol use: Not Currently    Alcohol/week: 0.0 oz  . Drug use: No  . Sexual activity: Yes    Partners: Male    Birth control/protection: Surgical    Comment: BTL  Lifestyle  . Physical activity:    Days per week: Not on file    Minutes per session: Not on file  . Stress: Not on file  Relationships  . Social connections:    Talks on phone: Not on file    Gets together: Not on file    Attends religious service: Not on file    Active member of club or organization: Not on file    Attends meetings of clubs or organizations:  Not on file    Relationship status: Not on file  Other Topics Concern  . Not on file  Social History Narrative  . Not on file   Additional Social History:    Allergies:  No Known Allergies  Labs:  Results for orders placed or performed during the hospital encounter of 04/13/18 (from the past 48 hour(s))  CBC     Status: Abnormal   Collection Time: 04/13/18  4:17 PM  Result Value Ref Range   WBC 12.0 (H) 3.6 - 11.0 K/uL   RBC 3.70 (L) 3.80 - 5.20 MIL/uL   Hemoglobin 9.3 (L) 12.0 - 16.0 g/dL   HCT 16.1 (L) 09.6 - 04.5 %   MCV 77.5 (L) 80.0 - 100.0 fL   MCH 25.1 (L) 26.0 - 34.0 pg  MCHC 32.4 32.0 - 36.0 g/dL   RDW 11.916.3 (H) 14.711.5 - 82.914.5 %   Platelets 247 150 - 440 K/uL    Comment: Performed at [facility], [address] [place], [place] [address]  RPR     Status: None   Collection Time: 04/13/18  4:17 PM  Result Value Ref Range   RPR Ser Ql Non Reactive Non Reactive    Comment: (NOTE) Performed At: [facility] [address] [place], [place] [id number] Jolene SchimkeNagendra Sanjai MD GE:[id number]:[phone]   Type and screen     Status: None   Collection Time: 04/13/18  4:17 PM  Result Value Ref Range   ABO/RH(D) O POS    Antibody Screen NEG    Sample Expiration      04/16/2018 Performed at [facility], [address] [place], [place] [address]   Chlamydia/NGC rt PCR [facility] only)     Status: None   Collection Time: 04/13/18  6:42 PM  Result Value Ref Range   Specimen source GC/Chlam ENDOCERVICAL    Chlamydia Tr NOT DETECTED NOT DETECTED   N gonorrhoeae NOT DETECTED NOT DETECTED    Comment: (NOTE) 100  This methodology has not been evaluated in pregnant women or in 200  patients with a history of hysterectomy. 300 400  This methodology will not be performed on patients less than 9514  years of age. Performed at [facility], [address] [place], [place] [address]   CBC     Status: Abnormal   Collection Time: 04/14/18  6:55 AM  Result Value  Ref Range   WBC 12.5 (H) 3.6 - 11.0 K/uL   RBC 3.38 (L) 3.80 - 5.20 MIL/uL   Hemoglobin 8.5 (L) 12.0 - 16.0 g/dL   HCT 44.026.3 (L) 34.735.0 - 42.547.0 %   MCV 77.8 (L) 80.0 - 100.0 fL   MCH 25.1 (L) 26.0 - 34.0 pg   MCHC 32.2 32.0 - 36.0 g/dL   RDW 95.616.3 (H) 38.711.5 - 56.414.5 %   Platelets 195 150 - 440 K/uL    Comment: Performed at [facility], [address] [place], [place] [address]  Urine Drug Screen, Qualitative ([facility] only)     Status: Abnormal   Collection Time: 04/15/18 11:15 AM  Result Value Ref Range   Tricyclic, Ur Screen NONE DETECTED NONE DETECTED   Amphetamines, Ur Screen NONE DETECTED NONE DETECTED   MDMA (Ecstasy)Ur Screen NONE DETECTED NONE DETECTED   Cocaine Metabolite,Ur [place] NONE DETECTED NONE DETECTED   Opiate, Ur Screen POSITIVE (A) NONE DETECTED   Phencyclidine (PCP) Ur S NONE DETECTED NONE DETECTED   Cannabinoid 50 Ng, Ur [place] NONE DETECTED NONE DETECTED   Barbiturates, Ur Screen (A) NONE DETECTED    Result not available. Reagent lot number recalled by manufacturer.   Benzodiazepine, Ur Scrn NONE DETECTED NONE DETECTED   Methadone Scn, Ur NONE DETECTED NONE DETECTED    Comment: (NOTE) Tricyclics + metabolites, urine    Cutoff 1000 ng/mL Amphetamines + metabolites, urine  Cutoff 1000 ng/mL MDMA (Ecstasy), urine              Cutoff 500 ng/mL Cocaine Metabolite, urine          Cutoff 300 ng/mL Opiate + metabolites, urine        Cutoff 300 ng/mL Phencyclidine (PCP), urine         Cutoff 25 ng/mL Cannabinoid, urine  Cutoff 50 ng/mL Barbiturates + metabolites, urine  Cutoff 200 ng/mL Benzodiazepine, urine              Cutoff 200 ng/mL Methadone, urine                   Cutoff 300 ng/mL The urine drug screen provides only a preliminary, unconfirmed analytical test result and should not be used for non-medical purposes. Clinical consideration and professional judgment should be applied to any positive drug screen result due to possible interfering  substances. A more specific alternate chemical method must be used in order to obtain a confirmed analytical result. Gas chromatography / mass spectrometry (GC/MS) is the preferred confirmat ory method. Performed at [facility], [address], [place], [place] [address]     Current Facility-Administered Medications  Medication Dose Route Frequency Provider Last Rate Last Dose  . acetaminophen (TYLENOL) tablet 650 mg  650 mg Oral Q4H PRN Conard Novak, MD      . benzocaine-Menthol (DERMOPLAST) 20-0.5 % topical spray 1 application  1 application Topical PRN Conard Novak, MD      . coconut oil  1 application Topical PRN Conard Novak, MD      . witch hazel-glycerin (TUCKS) pad 1 application  1 application Topical PRN Conard Novak, MD       And  . dibucaine (NUPERCAINAL) 1 % rectal ointment 1 application  1 application Rectal PRN Conard Novak, MD      . diphenhydrAMINE (BENADRYL) capsule 25 mg  25 mg Oral Q6H PRN Conard Novak, MD      . docusate sodium (COLACE) capsule 100 mg  100 mg Oral BID Conard Novak, MD   100 mg at 04/14/18 2141  . ferrous sulfate tablet 325 mg  325 mg Oral BID WC Conard Novak, MD   325 mg at 04/15/18 1013  . HYDROcodone-acetaminophen (NORCO/VICODIN) 5-325 MG per tablet 1 tablet  1 tablet Oral Q4H PRN Conard Novak, MD   1 tablet at 04/14/18 2322   Or  . HYDROcodone-acetaminophen (NORCO/VICODIN) 5-325 MG per tablet 2 tablet  2 tablet Oral Q4H PRN Conard Novak, MD   2 tablet at 04/15/18 1013  . HYDROmorphone (DILAUDID) injection 1 mg  1 mg Intravenous Q4H PRN Conard Novak, MD      . ibuprofen (ADVIL,MOTRIN) tablet 600 mg  600 mg Oral Q6H Schuman, Christanna R, MD      . lactated ringers infusion   Intravenous Continuous Conard Novak, MD 75 mL/hr at 04/14/18 1500    . magnesium hydroxide (MILK OF MAGNESIA) suspension 30 mL  30 mL Oral Q3 days PRN Conard Novak, MD   30 mL at 04/15/18 0109   . ondansetron (ZOFRAN) tablet 4 mg  4 mg Oral Q4H PRN Conard Novak, MD       Or  . ondansetron [facility]) injection 4 mg  4 mg Intravenous Q4H PRN Conard Novak, MD      . pantoprazole (PROTONIX) EC tablet 40 mg  40 mg Oral Daily Conard Novak, MD      . prenatal multivitamin tablet 1 tablet  1 tablet Oral Q1200 Conard Novak, MD      . simethicone [facility]) chewable tablet 80 mg  80 mg Oral PRN Conard Novak, MD      . zolpidem Remus Loffler) tablet 5 mg  5 mg Oral QHS PRN Conard Novak, MD  Musculoskeletal: Strength & Muscle Tone: within normal limits Gait & Station: normal Patient leans: N/A  Psychiatric Specialty Exam: Physical Exam  Nursing note and vitals reviewed. Psychiatric: She has a normal mood and affect. Her speech is normal and behavior is normal. Thought content normal. Cognition and memory are normal. She expresses impulsivity.    Review of Systems  Neurological: Negative.   Psychiatric/Behavioral: Negative.   All other systems reviewed and are negative.   Blood pressure 117/84, pulse (!) 50, temperature 97.8 F (36.6 C), resp. rate 18, height 5\' 5"  (1.651 m), weight 73.5 kg (162 lb), last menstrual period 07/12/2017, SpO2 99 %, not currently breastfeeding.Body mass index is 26.96 kg/m.  General Appearance: Casual  Eye Contact:  Good  Speech:  Clear and Coherent  Volume:  Normal  Mood:  Euthymic  Affect:  Appropriate  Thought Process:  Goal Directed and Descriptions of Associations: Intact  Orientation:  Full (Time, Place, and Person)  Thought Content:  WDL  Suicidal Thoughts:  No  Homicidal Thoughts:  No  Memory:  Immediate;   Fair Recent;   Fair Remote;   Fair  Judgement:  Impaired  Insight:  Present  Psychomotor Activity:  Normal  Concentration:  Concentration: Fair and Attention Span: Fair  Recall:  [employer] of Knowledge:  Fair  Language:  Fair  Akathisia:  No  Handed:  Right  AIMS (if indicated):     Assets:   Communication Skills Desire for Improvement Financial Resources/Insurance Housing Intimacy Physical Health Resilience Social Support  ADL's:  Intact  Cognition:  WNL  Sleep:        Treatment Plan Summary: Daily contact with patient to assess and evaluate symptoms and progress in treatment and Medication management   PLAN: 1. The patient is not interested in restarting antidepressant. She will her OBGYN if she feels depressed. 2. Please discharge as appropriate.  Disposition: No evidence of imminent risk to self or others at present.   Patient does not meet criteria for psychiatric inpatient admission. Supportive therapy provided about ongoing stressors. Discussed crisis plan, support from social network, calling 911, coming to the Emergency Department, and calling Suicide Hotline.  Kristine Linea, MD 04/15/2018 11:57 AM

## 2018-04-16 NOTE — Progress Notes (Signed)
Reviewed D/C instructions with pt and family. Pt verbalized understanding of teaching. Discharged to home via W/C. Pt to schedule f/u appt.  

## 2018-04-17 NOTE — Care Management (Addendum)
RNCM consulted over the weekend for follow up need since there was concern over patients motivations to make a follow up on her own. Rounding MD ask that someone from our department schedule this appointment for the patient. As previously documented I personally spoke with the patient over the weekend and she was in agreement to [facility] and [facility] care. I made the appointment for April 18, 2018 at 2:20 pm. I spoke with patient directly and confirmed her appointment. She tells me that time works for her and she plans on going. I provided the address and phone number for the clinic if needed.

## 2018-04-19 LAB — SURGICAL PATHOLOGY

## 2018-04-20 ENCOUNTER — Encounter: Payer: Self-pay | Admitting: Obstetrics and Gynecology

## 2018-04-20 ENCOUNTER — Ambulatory Visit (INDEPENDENT_AMBULATORY_CARE_PROVIDER_SITE_OTHER): Payer: Medicaid Other | Admitting: Obstetrics and Gynecology

## 2018-04-20 VITALS — BP 110/70 | HR 79 | Ht 65.0 in | Wt 135.0 lb

## 2018-04-20 DIAGNOSIS — O8612 Endometritis following delivery: Secondary | ICD-10-CM

## 2018-04-20 MED ORDER — AMOXICILLIN-POT CLAVULANATE 875-125 MG PO TABS: 1.0000 | ORAL_TABLET | Freq: Two times a day (BID) | ORAL | 0 refills | Status: AC

## 2018-04-20 NOTE — Progress Notes (Signed)
Obstetrics & Gynecology Office Visit   Chief Complaint  Patient presents with  . Wound Check    c/o abd pain   History of Present Illness: 22 y.o. [id number] female who presents POD#6 s/p postpartum BTL on PPD#1 s/p SVD. She presents with worsening abdominal pain. She does not know whether this is coming from her incision or not.  She states she was doing well and taking less pain medication until a couple of days ago. She has been having pain in her abdomen below her umbilicus.  The pain does not radiate. The pain is like a pressure and crampy.  She notes no issues with her incision. She also notes in increase in red blood in her lochia.  Nothing makes the pain better.  Bending over and standing up make the pain worse.  No associated symptoms. She specifically denies fevers, chills, nausea, vomiting, urinary symptoms, GI symptoms.  Her bleeding is not heavy.  Her pregnancy history is notable for advanced cervical dilation for about 5 weeks prior to her delivery. She was dilated to 4.5-5 cm at about 34 weeks.  When she presented for labor induction she was 8 cm dilated.    Past Medical History:  Diagnosis Date  . Chlamydia   . Headache(784.0)   . Pre-eclampsia   . UTI (lower urinary tract infection)     Past Surgical History:  Procedure Laterality Date  . ELBOW FRACTURE SURGERY  2012  . TUBAL LIGATION N/A 04/14/2018   Procedure: POST PARTUM TUBAL LIGATION;  Surgeon: Conard Novak, MD;  Location: [facility] ORS;  Service: Gynecology;  Laterality: N/A;    Gynecologic History: Patient's last menstrual period was 04/13/2018.  Obstetric History: [id number]  Family History  Problem Relation Age of Onset  . Heart failure Paternal Grandmother        pacemaker  . Hyperlipidemia Paternal Grandmother   . Hypertension Paternal Grandmother   . Cancer Paternal Grandmother 18       great grandmother  . Migraines Paternal Grandmother   . Stroke Paternal Grandfather   . ADD / ADHD Father   . ADD /  ADHD Brother   . Autism Other        paternal [place] Grandmother & Paternal Randie Heinz Aunt had Autism    Social History   Socioeconomic History  . Marital status: Single    Spouse name: Not on file  . Number of children: Not on file  . Years of education: Not on file  . Highest education level: Not on file  Occupational History  . Not on file  Social Needs  . Financial resource strain: Not on file  . Food insecurity:    Worry: Not on file    Inability: Not on file  . Transportation needs:    Medical: Not on file    Non-medical: Not on file  Tobacco Use  . Smoking status: Former Smoker    Types: Cigarettes    Last attempt to quit: 01/01/2010    Years since quitting: 8.3  . Smokeless tobacco: Never Used  Substance and Sexual Activity  . Alcohol use: Not Currently    Alcohol/week: 0.0 oz  . Drug use: No  . Sexual activity: Yes    Partners: Male    Birth control/protection: Surgical    Comment: BTL  Lifestyle  . Physical activity:    Days per week: Not on file    Minutes per session: Not on file  . Stress: Not on file  Relationships  .  Social connections:    Talks on phone: Not on file    Gets together: Not on file    Attends religious service: Not on file    Active member of club or organization: Not on file    Attends meetings of clubs or organizations: Not on file    Relationship status: Not on file  . Intimate partner violence:    Fear of current or ex partner: Not on file    Emotionally abused: Not on file    Physically abused: Not on file    Forced sexual activity: Not on file  Other Topics Concern  . Not on file  Social History Narrative  . Not on file    No Known Allergies  Prior to Admission medications   Medication Sig Start Date End Date Taking? Authorizing Provider  HYDROcodone-acetaminophen (NORCO/VICODIN) 5-325 MG tablet Take 2 tablets by mouth every 6 (six) hours as needed for severe pain (pain score >7/10). 04/15/18  Yes Vena Austria, MD    ibuprofen (ADVIL,MOTRIN) 600 MG tablet Take 1 tablet (600 mg total) by mouth every 6 (six) hours. 04/15/18  Yes Vena Austria, MD  calcium carbonate (TUMS - DOSED IN MG ELEMENTAL CALCIUM) 500 MG chewable tablet Chew 1 tablet by mouth 3 (three) times daily as needed for indigestion or heartburn.    [provider]  flintstones complete (FLINTSTONES) 60 MG chewable tablet Chew 2 tablets by mouth daily.    [provider]  omeprazole (PRILOSEC) 20 MG capsule Take 20 mg by mouth daily as needed (For heatrburn.).    [provider]    Review of Systems  Constitutional: Negative.  Negative for chills, fever and malaise/fatigue.  HENT: Negative.   Eyes: Negative.   Respiratory: Negative.   Cardiovascular: Negative.   Gastrointestinal: Positive for abdominal pain (see HPI). Negative for blood in stool, constipation, diarrhea, heartburn, melena, nausea and vomiting.  Genitourinary: Negative.        See HPI  Musculoskeletal: Negative.   Skin: Negative.   Neurological: Negative.   Psychiatric/Behavioral: Negative.      Physical Exam BP 110/70 (BP Location: Left Arm, Patient Position: Sitting, Cuff Size: Normal)   Pulse 79   Ht 5\' 5"  (1.651 m)   Wt 135 lb (61.2 kg)   LMP 04/13/2018   Breastfeeding? Yes   BMI 22.47 kg/m  Patient's last menstrual period was 04/13/2018. Physical Exam  Constitutional: She is oriented to person, place, and time. She appears well-developed and well-nourished. No distress.  HENT:  Head: Normocephalic and atraumatic.  Eyes: Conjunctivae are normal. No scleral icterus.  Cardiovascular: Normal rate and regular rhythm.  Pulmonary/Chest: Effort normal and breath sounds normal. No respiratory distress. She has no wheezes.  Abdominal: Soft. Bowel sounds are normal. She exhibits mass (uterus is present at U-4). She exhibits no distension. There is tenderness (fundal and general uterine tenderness). There is no rebound and no guarding.   Incision: clean, dry, intact, without erythema, induration, warmth, and tenderness  Musculoskeletal: Normal range of motion. She exhibits no edema.  Neurological: She is alert and oriented to person, place, and time. No cranial nerve deficit.  Skin: Skin is warm and dry. No rash noted.  Psychiatric: She has a normal mood and affect. Her behavior is normal. Judgment normal.   EPDS: 4  Assessment: 22 y.o. [id number] female here for  1. Endometritis following delivery      Plan: Problem List Items Addressed This Visit    None    Visit  Diagnoses    Endometritis following delivery    -  Primary   Relevant Medications   amoxicillin-clavulanate (AUGMENTIN) 875-125 MG tablet     Given the constellation of symptoms she has, I am concerned for an early postpartum endometritis.  She is not febrile. So, I believe outpatient treatment is reasonable at this time.  She was given precautions for worsening symptoms, including fever, chills, abdominal pain, vaginal bleeding.  If any of these develop, she should report to the ER.  She did have a postpartum BTL. However, she is having normal bowel movement and normal urinary habits, she is not nauseated or vomiting. So, I would put a post-op complication lower on the differential, though it must be considered.  Close follow up to ensure proper response.   Return in 4 days (on 04/24/2018) for Medication follow up Dr. Jean RosenthalJackson.   Thomasene MohairStephen Vin Yonke, MD 04/20/2018 4:36 PM

## 2018-04-22 ENCOUNTER — Encounter: Payer: Self-pay | Admitting: Emergency Medicine

## 2018-04-22 ENCOUNTER — Emergency Department
Admission: EM | Admit: 2018-04-22 | Discharge: 2018-04-22 | Disposition: A | Discharge status: Home / Self Care | Payer: Medicaid Other | Attending: Emergency Medicine | Admitting: Emergency Medicine

## 2018-04-22 ENCOUNTER — Other Ambulatory Visit: Payer: Self-pay

## 2018-04-22 ENCOUNTER — Emergency Department: Payer: Medicaid Other

## 2018-04-22 DIAGNOSIS — Z79899 Other long term (current) drug therapy: Secondary | ICD-10-CM | POA: Diagnosis not present

## 2018-04-22 DIAGNOSIS — R102 Pelvic and perineal pain: Secondary | ICD-10-CM | POA: Diagnosis not present

## 2018-04-22 DIAGNOSIS — Z87891 Personal history of nicotine dependence: Secondary | ICD-10-CM | POA: Insufficient documentation

## 2018-04-22 DIAGNOSIS — N719 Inflammatory disease of uterus, unspecified: Secondary | ICD-10-CM | POA: Insufficient documentation

## 2018-04-22 LAB — CBC
HCT: 36.7 % (ref 35.0–47.0)
Hemoglobin: 12 g/dL (ref 12.0–16.0)
MCH: 25.7 pg — ABNORMAL LOW (ref 26.0–34.0)
MCHC: 32.7 g/dL (ref 32.0–36.0)
MCV: 78.5 fL — ABNORMAL LOW (ref 80.0–100.0)
PLATELETS: 430 10*3/uL (ref 150–440)
RBC: 4.67 MIL/uL (ref 3.80–5.20)
RDW: 17.2 % — ABNORMAL HIGH (ref 11.5–14.5)
WBC: 7.2 10*3/uL (ref 3.6–11.0)

## 2018-04-22 LAB — URINALYSIS, COMPLETE (UACMP) WITH MICROSCOPIC
BACTERIA UA: NONE SEEN
Bilirubin Urine: NEGATIVE
Glucose, UA: NEGATIVE mg/dL
KETONES UR: NEGATIVE mg/dL
Leukocytes, UA: NEGATIVE
NITRITE: NEGATIVE
Protein, ur: NEGATIVE mg/dL
Specific Gravity, Urine: 1.023 (ref 1.005–1.030)
pH: 6 (ref 5.0–8.0)

## 2018-04-22 LAB — COMPREHENSIVE METABOLIC PANEL
ALT: 30 U/L (ref 0–44)
ANION GAP: 10 (ref 5–15)
AST: 27 U/L (ref 15–41)
Albumin: 3.9 g/dL (ref 3.5–5.0)
Alkaline Phosphatase: 168 U/L — ABNORMAL HIGH (ref 38–126)
BUN: 15 mg/dL (ref 6–20)
CHLORIDE: 105 mmol/L (ref 98–111)
CO2: 24 mmol/L (ref 22–32)
Calcium: 9 mg/dL (ref 8.9–10.3)
Creatinine, Ser: 0.53 mg/dL (ref 0.44–1.00)
GFR calc non Af Amer: >60 mL/min (ref >60)
Glucose, Bld: 117 mg/dL — ABNORMAL HIGH (ref 70–99)
Potassium: 3.9 mmol/L (ref 3.5–5.1)
SODIUM: 139 mmol/L (ref 135–145)
Total Bilirubin: 0.7 mg/dL (ref 0.3–1.2)
Total Protein: 7.7 g/dL (ref 6.5–8.1)

## 2018-04-22 LAB — LIPASE, BLOOD: LIPASE: 29 U/L (ref 11–51)

## 2018-04-22 MED ORDER — MORPHINE SULFATE (PF) 4 MG/ML IV SOLN
4.0000 mg | Freq: Once | INTRAVENOUS | Status: AC
  Administered 2018-04-22: 4 mg via INTRAVENOUS
  Filled 2018-04-22: qty 1

## 2018-04-22 NOTE — ED Notes (Signed)
[facility].

## 2018-04-22 NOTE — ED Provider Notes (Addendum)
[facility] Emergency Department Provider Note  ____________________________________________   I have reviewed the triage vital signs and the nursing notes. Where available I have reviewed prior notes and, if possible and indicated, outside hospital notes.    HISTORY  Chief Complaint Abdominal Pain    HPI Meagan Carpenter is a 22 y.o. female who is G4, P3 gave birth with a rapid induction 9 days ago, presents today complaining of abdominal pain.  Patient states she has had no fever or vomiting, but there is a suprapubic discomfort.  She was seen by her GYN on Thursday, they gave her antibiotics which she has been taking since then however, she continues to have worsening pain.  She is passing lochia and there is some bright blood in there she states.  Less than a pad today however.  No diarrhea no dysuria no other complaints or symptoms.  Just increasing discomfort, she was told that if it got worse she was to come to the emergency room and she did. Worse when she touches it or when she moves nothing makes it better no other alleviating or aggravating symptoms no prior treatment   Past Medical History:  Diagnosis Date  . Chlamydia   . Headache(784.0)   . Pre-eclampsia   . UTI (lower urinary tract infection)     Patient Active Problem List   Diagnosis Date Noted  . Major depressive disorder, recurrent, with postpartum onset (HCC) 04/15/2018  . Labor and delivery, indication for care 04/13/2018  . Preterm uterine contractions in third trimester, antepartum 03/11/2018  . Preterm labor in third trimester 03/11/2018  . Anemia affecting pregnancy in third trimester 01/24/2018  . Nausea and vomiting during pregnancy prior to [redacted] weeks gestation 09/23/2017  . Supervision of high risk pregnancy, antepartum, first trimester 08/22/2017  . Hx of preeclampsia, prior pregnancy, currently pregnant, third trimester 08/22/2017  . Tension headache 03/23/2013  . Migraine  headache without aura 03/23/2013    Past Surgical History:  Procedure Laterality Date  . ELBOW FRACTURE SURGERY  2012  . TUBAL LIGATION N/A 04/14/2018   Procedure: POST PARTUM TUBAL LIGATION;  Surgeon: Conard NovakJackson, Stephen D, MD;  Location: [facility] ORS;  Service: Gynecology;  Laterality: N/A;    Prior to Admission medications   Medication Sig Start Date End Date Taking? Authorizing Provider  amoxicillin-clavulanate (AUGMENTIN) 875-125 MG tablet Take 1 tablet by mouth 2 (two) times daily for 10 days. 04/20/18 04/30/18  Conard NovakJackson, Stephen D, MD  calcium carbonate (TUMS - DOSED IN MG ELEMENTAL CALCIUM) 500 MG chewable tablet Chew 1 tablet by mouth 3 (three) times daily as needed for indigestion or heartburn.    [provider]  flintstones complete (FLINTSTONES) 60 MG chewable tablet Chew 2 tablets by mouth daily.    [provider]  HYDROcodone-acetaminophen (NORCO/VICODIN) 5-325 MG tablet Take 2 tablets by mouth every 6 (six) hours as needed for severe pain (pain score >7/10). 04/15/18   Vena AustriaStaebler, Andreas, MD  ibuprofen (ADVIL,MOTRIN) 600 MG tablet Take 1 tablet (600 mg total) by mouth every 6 (six) hours. 04/15/18   Vena AustriaStaebler, Andreas, MD  omeprazole (PRILOSEC) 20 MG capsule Take 20 mg by mouth daily as needed (For heatrburn.).    [provider]    Allergies Patient has no known allergies.  Family History  Problem Relation Age of Onset  . Heart failure Paternal Grandmother        pacemaker  . Hyperlipidemia Paternal Grandmother   . Hypertension Paternal Grandmother   . Cancer  Paternal Grandmother 30       great grandmother  . Migraines Paternal Grandmother   . Stroke Paternal Grandfather   . ADD / ADHD Father   . ADD / ADHD Brother   . Autism Other        paternal [place] Grandmother & Paternal Randie Heinz Aunt had Autism    Social History Social History   Tobacco Use  . Smoking status: Former Smoker    Types: Cigarettes    Last attempt to quit: 01/01/2010    Years  since quitting: 8.3  . Smokeless tobacco: Never Used  Substance Use Topics  . Alcohol use: Not Currently    Alcohol/week: 0.0 oz  . Drug use: No    Review of Systems Constitutional: No fever/chills Eyes: No visual changes. ENT: No sore throat. No stiff neck no neck pain Cardiovascular: Denies chest pain. Respiratory: Denies shortness of breath. Gastrointestinal:   no vomiting.  No diarrhea.  No constipation. Genitourinary: Negative for dysuria. Musculoskeletal: Negative lower extremity swelling Skin: Negative for rash. Neurological: Negative for severe headaches, focal weakness or numbness.   ____________________________________________   PHYSICAL EXAM:  VITAL SIGNS: ED Triage Vitals  Enc Vitals Group     BP 04/22/18 1137 139/83     Pulse Rate 04/22/18 1137 91     Resp 04/22/18 1137 15     Temp 04/22/18 1137 98.4 F (36.9 C)     Temp Source 04/22/18 1137 Oral     SpO2 04/22/18 1137 99 %     Weight 04/22/18 1138 134 lb (60.8 kg)     Height 04/22/18 1138 5\' 5"  (1.651 m)     Head Circumference --      Peak Flow --      Pain Score 04/22/18 1148 0     Pain Loc --      Pain Edu? --      Excl. in GC? --     Constitutional: Alert and oriented. Well appearing and in no acute distress. Eyes: Conjunctivae are normal Head: Atraumatic HEENT: No congestion/rhinnorhea. Mucous membranes are moist.  Oropharynx non-erythematous Neck:   Nontender with no meningismus, no masses, no stridor Cardiovascular: Normal rate, regular rhythm. Grossly normal heart sounds.  Good peripheral circulation. Respiratory: Normal respiratory effort.  No retractions. Lungs CTAB. Abdominal: Soft and suprapubic tenderness noted. No distention. No guarding no rebound Back:  There is no focal tenderness or step off.  there is no midline tenderness there are no lesions noted. there is no CVA tenderness Pelvic exam: Female nurse chaperone present, no external lesions noted, physiologic vaginal discharge  noted with no purulent discharge, positive uterine tenderness, dark lochia noted, no purulence, no adnexal masses or tenderness noted, no adnexal tenderness or mass, Musculoskeletal: No lower extremity tenderness, no upper extremity tenderness. No joint effusions, no DVT signs strong distal pulses no edema Neurologic:  Normal speech and language. No gross focal neurologic deficits are appreciated.  Skin:  Skin is warm, dry and intact. No rash noted. Psychiatric: Mood and affect are normal. Speech and behavior are normal.  ____________________________________________   LABS (all labs ordered are listed, but only abnormal results are displayed)  Labs Reviewed  COMPREHENSIVE METABOLIC PANEL - Abnormal; Notable for the following components:      Result Value   Glucose, Bld 117 (*)    Alkaline Phosphatase 168 (*)    All other components within normal limits  CBC - Abnormal; Notable for the following components:   MCV 78.5 (*)  MCH 25.7 (*)    RDW 17.2 (*)    All other components within normal limits  URINALYSIS, COMPLETE (UACMP) WITH MICROSCOPIC - Abnormal; Notable for the following components:   Color, Urine YELLOW (*)    APPearance CLEAR (*)    Hgb urine dipstick MODERATE (*)    All other components within normal limits  LIPASE, BLOOD    Pertinent labs  results that were available during my care of the patient were reviewed by me and considered in my medical decision making (see chart for details). ____________________________________________  EKG  I personally interpreted any EKGs ordered by me or triage  ____________________________________________  RADIOLOGY  Pertinent labs & imaging results that were available during my care of the patient were reviewed by me and considered in my medical decision making (see chart for details). If possible, patient and/or family made aware of any abnormal findings.  No results found. ____________________________________________     PROCEDURES  Procedure(s) performed: None  Procedures  Critical Care performed: None  ____________________________________________   INITIAL IMPRESSION / ASSESSMENT AND PLAN / ED COURSE  Pertinent labs & imaging results that were available during my care of the patient were reviewed by me and considered in my medical decision making (see chart for details).  She is here for continuing and worsening postpartum suprapubic discomfort has been treated for postpartum endometritis with antibiotics but is continually decreasing in comfort.  We will obtain ultrasound.  Blood work is reassuring, pelvic exam is thus far reassuring although she is somewhat tender.  ----------------------------------------- 3:47 PM on 04/22/2018 -----------------------------------------   US shows likely rpc, I talked to  Dr. Pauline Aus; he is aware the patient in the case, we discussed all the findings here including exam, she will evaluate further and call back. Signed out to dr sidecki at the end of my shift.      ____________________________________________   FINAL CLINICAL IMPRESSION(S) / ED DIAGNOSES  Final diagnoses:  Pelvic pain      This chart was dictated using voice recognition software.  Despite best efforts to proofread,  errors can occur which can change meaning.      Jeanmarie Plant, MD 04/22/18 1437    Jeanmarie Plant, MD 04/22/18 [id number]

## 2018-04-22 NOTE — ED Triage Notes (Signed)
States lower abd pain x 4 days. States vaginal delivery 7lb 11oz baby 9 days ago, tubal ligation 8 days ago. Denies fevers.

## 2018-04-22 NOTE — ED Notes (Signed)
Pt states is 9 days post partum from having 3rd baby, pt states 8 days post tubal ligation. Pt states went to PCP on Thursday and was given abx for possible uterine infection, pt states today pain is worse and is unable to lift her arms above her head due to pain. Pt states continued vag bleeding, states turned a dark brown color then went back to passing clots and bright red. Surgical incision intact without signs of infection. Pt is alert and oriented at this time.

## 2018-04-22 NOTE — ED Notes (Signed)
Patient transported to Ultrasound 

## 2018-04-22 NOTE — ED Provider Notes (Signed)
-----------------------------------------   4:54 PM on 04/22/2018 -----------------------------------------  I took over care of this patient from Dr. Alphonzo LemmingsMcShane.  I discussed the case with Dr[facility], who Dr. Alphonzo LemmingsMcShane had consulted.  Dr. Gaynelle ArabianShuman advises that the ultrasound findings are consistent with blood and endometritis and given the patient's reassuring labs, she is appropriate for discharge home with continued antibiotics.  There is no evidence of retained POC's or indication for D&C at this time.  I discussed the results and the OB/GYN recommendations with the patient, who agreed with the plan.  She is following up with her OB/GYN Dr. Jean RosenthalJackson in 2 days on Monday.  Return precautions given, and she expresses understanding.   Dionne BucySiadecki, Ashton Sabine, MD 04/22/18 1655

## 2018-04-22 NOTE — ED Notes (Signed)
After admin of morphine pt noted to have a slight reaction to the morphine, pt with noted rash to L forearm. Pt denies CP, SOB, or difficulty swallowing or speaking. Pt endorsed that rash was clearing up, this RN reviewed allergic reaction precautions with patient and to use call bell if she felt any of the symptoms listed above. Pt stated understanding, MD made aware.

## 2018-04-22 NOTE — Discharge Instructions (Addendum)
Continue your antibiotics and pain medication as prescribed.  Follow-up with your OB/GYN on Monday as scheduled.  Return to the ER for new, worsening, or persistent severe pain, bleeding, fevers, vomiting, weakness, or any other new or worsening symptoms that concern you.

## 2018-04-24 ENCOUNTER — Ambulatory Visit (INDEPENDENT_AMBULATORY_CARE_PROVIDER_SITE_OTHER): Payer: Medicaid Other | Admitting: Obstetrics and Gynecology

## 2018-04-24 ENCOUNTER — Encounter: Payer: Self-pay | Admitting: Obstetrics and Gynecology

## 2018-04-24 DIAGNOSIS — O8612 Endometritis following delivery: Secondary | ICD-10-CM

## 2018-04-24 NOTE — Progress Notes (Signed)
Obstetrics & Gynecology Office Visit   Chief Complaint  Patient presents with  . Postpartum Care   History of Present Illness: 22 y.o. [phone] female who is now 11 days PP.  She has had abdominal pain, though improving and some more vaginal bleeding, though it seems to be getting lighter.  Her tenderness, according to her, has improved over the past couple of days. She states she went to the ER 2 days ago and an ultrasound showed a blood clot in the uterus, but no evidence of retained products of conception.  She was not taken for a D&C.  She denies fevers, chills, and other symptoms.   Past Medical History:  Diagnosis Date  . Chlamydia   . Headache(784.0)   . Pre-eclampsia   . UTI (lower urinary tract infection)     Past Surgical History:  Procedure Laterality Date  . ELBOW FRACTURE SURGERY  2012  . TUBAL LIGATION N/A 04/14/2018   Procedure: POST PARTUM TUBAL LIGATION;  Surgeon: Conard Novak, MD;  Location: [facility] ORS;  Service: Gynecology;  Laterality: N/A;    Gynecologic History: Patient's last menstrual period was 04/13/2018.  Obstetric History: [name]  Family History  Problem Relation Age of Onset  . Heart failure Paternal Grandmother        pacemaker  . Hyperlipidemia Paternal Grandmother   . Hypertension Paternal Grandmother   . Cancer Paternal Grandmother 60       great grandmother  . Migraines Paternal Grandmother   . Stroke Paternal Grandfather   . ADD / ADHD Father   . ADD / ADHD Brother   . Autism Other        paternal [place] Grandmother & Paternal Randie Heinz Aunt had Autism    Social History   Socioeconomic History  . Marital status: Single    Spouse name: Not on file  . Number of children: Not on file  . Years of education: Not on file  . Highest education level: Not on file  Occupational History  . Not on file  Social Needs  . Financial resource strain: Not on file  . Food insecurity:    Worry: Not on file    Inability: Not on file  .  Transportation needs:    Medical: Not on file    Non-medical: Not on file  Tobacco Use  . Smoking status: Former Smoker    Types: Cigarettes    Last attempt to quit: 01/01/2010    Years since quitting: 8.3  . Smokeless tobacco: Never Used  Substance and Sexual Activity  . Alcohol use: Not Currently    Alcohol/week: 0.0 oz  . Drug use: No  . Sexual activity: Yes    Partners: Male    Birth control/protection: Surgical    Comment: BTL  Lifestyle  . Physical activity:    Days per week: Not on file    Minutes per session: Not on file  . Stress: Not on file  Relationships  . Social connections:    Talks on phone: Not on file    Gets together: Not on file    Attends religious service: Not on file    Active member of club or organization: Not on file    Attends meetings of clubs or organizations: Not on file    Relationship status: Not on file  . Intimate partner violence:    Fear of current or ex partner: Not on file    Emotionally abused: Not on file    Physically abused: Not  on file    Forced sexual activity: Not on file  Other Topics Concern  . Not on file  Social History Narrative  . Not on file    No Known Allergies  Prior to Admission medications   Medication Sig Start Date End Date Taking? Authorizing Provider  amoxicillin-clavulanate (AUGMENTIN) 875-125 MG tablet Take 1 tablet by mouth 2 (two) times daily for 10 days. 04/20/18 04/30/18  Conard NovakJackson, Areal Cochrane D, MD  calcium carbonate (TUMS - DOSED IN MG ELEMENTAL CALCIUM) 500 MG chewable tablet Chew 1 tablet by mouth 3 (three) times daily as needed for indigestion or heartburn.    [provider]  flintstones complete (FLINTSTONES) 60 MG chewable tablet Chew 2 tablets by mouth daily.    [provider]  HYDROcodone-acetaminophen (NORCO/VICODIN) 5-325 MG tablet Take 2 tablets by mouth every 6 (six) hours as needed for severe pain (pain score >7/10). 04/15/18   Vena AustriaStaebler, Andreas, MD  ibuprofen (ADVIL,MOTRIN)  600 MG tablet Take 1 tablet (600 mg total) by mouth every 6 (six) hours. 04/15/18   Vena AustriaStaebler, Andreas, MD  omeprazole (PRILOSEC) 20 MG capsule Take 20 mg by mouth daily as needed (For heatrburn.).    [provider]    Review of Systems  Constitutional: Negative.   HENT: Negative.   Eyes: Negative.   Respiratory: Negative.   Cardiovascular: Negative.   Gastrointestinal: Positive for abdominal pain. Negative for blood in stool, constipation, diarrhea, heartburn, melena, nausea and vomiting.  Genitourinary: Negative.   Musculoskeletal: Negative.   Skin: Negative.   Neurological: Negative.   Psychiatric/Behavioral: Negative.      Physical Exam BP 108/70 (BP Location: Left Arm, Patient Position: Sitting, Cuff Size: Normal)   Pulse 76   Ht 5\' 5"  (1.651 m)   Wt 134 lb (60.8 kg)   LMP 04/13/2018   SpO2 99%   BMI 22.30 kg/m  Patient's last menstrual period was 04/13/2018. Physical Exam  Constitutional: She is oriented to person, place, and time. She appears well-developed and well-nourished. No distress.  HENT:  Head: Normocephalic and atraumatic.  Eyes: Conjunctivae are normal. No scleral icterus.  Cardiovascular: Normal rate and regular rhythm.  Pulmonary/Chest: Effort normal and breath sounds normal. No respiratory distress.  Abdominal: Soft. Bowel sounds are normal. She exhibits no distension. Mass: uterus at U-4. There is no tenderness. There is no rebound and no guarding.  Musculoskeletal: Normal range of motion. She exhibits no edema.  Neurological: She is alert and oriented to person, place, and time.  Skin: Skin is warm and dry. No erythema.  Psychiatric: She has a normal mood and affect. Her behavior is normal. Judgment normal.   Assessment: 22 y.o. [id number] female here for  1. Endometritis following delivery      Plan: Problem List Items Addressed This Visit      Genitourinary   Endometritis following delivery     She appears to be improving slowly.  Will  continue to monitor carefully.  Will have her follow up for soon. Precautions given.   Return in about 4 days (around 04/28/2018) for Follow up Dr Jean RosenthalJackson (may double book).   Thomasene MohairStephen Noris Kulinski, MD 04/24/2018 5:31 PM

## 2018-04-27 ENCOUNTER — Ambulatory Visit: Payer: Medicaid Other | Admitting: Obstetrics and Gynecology

## 2018-05-24 ENCOUNTER — Ambulatory Visit: Payer: Medicaid Other | Admitting: Obstetrics and Gynecology

## 2018-11-17 ENCOUNTER — Emergency Department
Admission: EM | Admit: 2018-11-17 | Discharge: 2018-11-17 | Disposition: A | Discharge status: Home / Self Care | Payer: Medicaid Other | Attending: Emergency Medicine | Admitting: Emergency Medicine

## 2018-11-17 ENCOUNTER — Encounter: Payer: Self-pay | Admitting: Emergency Medicine

## 2018-11-17 DIAGNOSIS — R112 Nausea with vomiting, unspecified: Secondary | ICD-10-CM | POA: Diagnosis present

## 2018-11-17 DIAGNOSIS — Z79899 Other long term (current) drug therapy: Secondary | ICD-10-CM | POA: Insufficient documentation

## 2018-11-17 DIAGNOSIS — Z87891 Personal history of nicotine dependence: Secondary | ICD-10-CM | POA: Insufficient documentation

## 2018-11-17 DIAGNOSIS — K529 Noninfective gastroenteritis and colitis, unspecified: Secondary | ICD-10-CM | POA: Insufficient documentation

## 2018-11-17 DIAGNOSIS — Z20828 Contact with and (suspected) exposure to other viral communicable diseases: Secondary | ICD-10-CM | POA: Insufficient documentation

## 2018-11-17 LAB — COMPREHENSIVE METABOLIC PANEL
ALBUMIN: 4.9 g/dL (ref 3.5–5.0)
ALT: 14 U/L (ref 0–44)
ANION GAP: 7 (ref 5–15)
AST: 20 U/L (ref 15–41)
Alkaline Phosphatase: 70 U/L (ref 38–126)
BILIRUBIN TOTAL: 0.9 mg/dL (ref 0.3–1.2)
BUN: 13 mg/dL (ref 6–20)
CO2: 28 mmol/L (ref 22–32)
Calcium: 9 mg/dL (ref 8.9–10.3)
Chloride: 104 mmol/L (ref 98–111)
Creatinine, Ser: 0.62 mg/dL (ref 0.44–1.00)
GLUCOSE: 129 mg/dL — AB (ref 70–99)
Potassium: 4 mmol/L (ref 3.5–5.1)
Sodium: 139 mmol/L (ref 135–145)
TOTAL PROTEIN: 7.6 g/dL (ref 6.5–8.1)

## 2018-11-17 LAB — CBC
HCT: 37.1 % (ref 36.0–46.0)
HEMOGLOBIN: 11.8 g/dL — AB (ref 12.0–15.0)
MCH: 26.1 pg (ref 26.0–34.0)
MCHC: 31.8 g/dL (ref 30.0–36.0)
MCV: 82.1 fL (ref 80.0–100.0)
NRBC: 0 % (ref 0.0–0.2)
PLATELETS: 280 10*3/uL (ref 150–400)
RBC: 4.52 MIL/uL (ref 3.87–5.11)
RDW: 14.1 % (ref 11.5–15.5)
WBC: 12.9 10*3/uL — AB (ref 4.0–10.5)

## 2018-11-17 LAB — INFLUENZA PANEL BY PCR (TYPE A & B)
INFLAPCR: NEGATIVE
INFLBPCR: NEGATIVE

## 2018-11-17 LAB — LIPASE, BLOOD: Lipase: 26 U/L (ref 11–51)

## 2018-11-17 MED ORDER — ONDANSETRON HCL 4 MG/2ML IJ SOLN
4.0000 mg | Freq: Once | INTRAMUSCULAR | Status: AC
  Administered 2018-11-17: 4 mg via INTRAVENOUS
  Filled 2018-11-17: qty 2

## 2018-11-17 MED ORDER — ONDANSETRON HCL 4 MG PO TABS: 4.0000 mg | ORAL_TABLET | Freq: Three times a day (TID) | ORAL | 0 refills | Status: AC | PRN

## 2018-11-17 MED ORDER — SODIUM CHLORIDE 0.9% FLUSH: 3.0000 mL | Freq: Once | INTRAVENOUS | Status: DC

## 2018-11-17 MED ORDER — SODIUM CHLORIDE 0.9 % IV BOLUS
1000.0000 mL | Freq: Once | INTRAVENOUS | Status: AC
  Administered 2018-11-17: 1000 mL via INTRAVENOUS

## 2018-11-17 NOTE — ED Provider Notes (Signed)
[facility] Emergency Department Provider Note  ____________________________________________   I have reviewed the triage vital signs and the nursing notes.   HISTORY  Chief Complaint Emesis; Diarrhea; and Fever   History limited by: Not Limited   HPI Meagan Carpenter is a 23 y.o. female who presents to the emergency department today because of concerns for nausea vomiting, weakness and concerns for fever patient states that her symptoms started today after work.  The first thing she felt was weakness.  She then started developing some nausea and vomiting.  She denies any blood in her vomit.  She did not check her temperature at home was febrile.  The patient states that a family member has been sick with viral illness for the past few weeks.    Per medical record review patient has a history of UTI  Past Medical History:  Diagnosis Date  . Chlamydia   . Headache(784.0)   . Pre-eclampsia   . UTI (lower urinary tract infection)     Patient Active Problem List   Diagnosis Date Noted  . Endometritis following delivery 04/24/2018  . Major depressive disorder, recurrent, with postpartum onset (HCC) 04/15/2018  . Labor and delivery, indication for care 04/13/2018  . Preterm uterine contractions in third trimester, antepartum 03/11/2018  . Preterm labor in third trimester 03/11/2018  . Anemia affecting pregnancy in third trimester 01/24/2018  . Nausea and vomiting during pregnancy prior to [redacted] weeks gestation 09/23/2017  . Supervision of high risk pregnancy, antepartum, first trimester 08/22/2017  . Hx of preeclampsia, prior pregnancy, currently pregnant, third trimester 08/22/2017  . Tension headache 03/23/2013  . Migraine headache without aura 03/23/2013    Past Surgical History:  Procedure Laterality Date  . ELBOW FRACTURE SURGERY  2012  . TUBAL LIGATION N/A 04/14/2018   Procedure: POST PARTUM TUBAL LIGATION;  Surgeon: Conard Novak, MD;   Location: [facility] ORS;  Service: Gynecology;  Laterality: N/A;    Prior to Admission medications   Medication Sig Start Date End Date Taking? Authorizing Provider  calcium carbonate (TUMS - DOSED IN MG ELEMENTAL CALCIUM) 500 MG chewable tablet Chew 1 tablet by mouth 3 (three) times daily as needed for indigestion or heartburn.    [provider]  flintstones complete (FLINTSTONES) 60 MG chewable tablet Chew 2 tablets by mouth daily.    [provider]  HYDROcodone-acetaminophen (NORCO/VICODIN) 5-325 MG tablet Take 2 tablets by mouth every 6 (six) hours as needed for severe pain (pain score >7/10). 04/15/18   Vena Austria, MD  ibuprofen (ADVIL,MOTRIN) 600 MG tablet Take 1 tablet (600 mg total) by mouth every 6 (six) hours. 04/15/18   Vena Austria, MD  omeprazole (PRILOSEC) 20 MG capsule Take 20 mg by mouth daily as needed (For heatrburn.).    [provider]    Allergies Patient has no known allergies.  Family History  Problem Relation Age of Onset  . Heart failure Paternal Grandmother        pacemaker  . Hyperlipidemia Paternal Grandmother   . Hypertension Paternal Grandmother   . Cancer Paternal Grandmother 2       great grandmother  . Migraines Paternal Grandmother   . Stroke Paternal Grandfather   . ADD / ADHD Father   . ADD / ADHD Brother   . Autism Other        paternal [place] Grandmother & Paternal Randie Heinz Aunt had Autism    Social History Social History   Tobacco Use  . Smoking status:  Former Smoker    Types: Cigarettes    Last attempt to quit: 01/01/2010    Years since quitting: 8.8  . Smokeless tobacco: Never Used  Substance Use Topics  . Alcohol use: Not Currently    Alcohol/week: 0.0 standard drinks  . Drug use: No    Review of Systems Constitutional: Positive for fever Eyes: No visual changes. ENT: No sore throat. Cardiovascular: Denies chest pain. Respiratory: Denies shortness of breath. Gastrointestinal: No abdominal pain.  Positive for nausea, vomiting. Genitourinary: Negative for dysuria. Musculoskeletal: Negative for back pain. Skin: Negative for rash. Neurological: Negative for headaches, focal weakness or numbness.  ____________________________________________   PHYSICAL EXAM:  VITAL SIGNS: ED Triage Vitals  Enc Vitals Group     BP 11/17/18 1933 (!) 123/92     Pulse Rate 11/17/18 1933 (!) 128     Resp 11/17/18 1933 18     Temp 11/17/18 1933 99.6 F (37.6 C)     Temp Source 11/17/18 1933 Oral     SpO2 11/17/18 1933 99 %     Weight 11/17/18 1934 125 lb (56.7 kg)     Height 11/17/18 1934 5\' 5"  (1.651 m)   Constitutional: Alert and oriented.  Eyes: Conjunctivae are normal.  ENT      Head: Normocephalic and atraumatic.      Nose: No congestion/rhinnorhea.      Mouth/Throat: Mucous membranes are moist.      Neck: No stridor. Hematological/Lymphatic/Immunilogical: No cervical lymphadenopathy. Cardiovascular: Tachycardic, regular rhythm.  No murmurs, rubs, or gallops.  Respiratory: Normal respiratory effort without tachypnea nor retractions. Breath sounds are clear and equal bilaterally. No wheezes/rales/rhonchi. Gastrointestinal: Soft and non tender. No rebound. No guarding.  Genitourinary: Deferred Musculoskeletal: Normal range of motion in all extremities. No lower extremity edema. Neurologic:  Normal speech and language. No gross focal neurologic deficits are appreciated.  Skin:  Skin is warm, dry and intact. No rash noted. Psychiatric: Mood and affect are normal. Speech and behavior are normal. Patient exhibits appropriate insight and judgment.  ____________________________________________    LABS (pertinent positives/negatives)  Influenza negative Lipase 26 CBC wbc 12.9, hgb 11.8, plt 280 CMP wnl except glu 129  ____________________________________________   EKG  None  ____________________________________________     RADIOLOGY  None  ____________________________________________   PROCEDURES  Procedures  ____________________________________________   INITIAL IMPRESSION / ASSESSMENT AND PLAN / ED COURSE  Pertinent labs & imaging results that were available during my care of the patient were reviewed by me and considered in my medical decision making (see chart for details).   Patient presented to the emergency department today with concerns for nausea vomiting fever and fatigue. Blood work with mild leukocytosis but otherwise without concerning findings.  Patient was given IV fluids and Zofran and did feel better.  At this point I think gastroenteritis likely.  Patient's abdomen is benign and I doubt focal infection like appendicitis or cholecystitis.  Discussed plan with patient.  ____________________________________________   FINAL CLINICAL IMPRESSION(S) / ED DIAGNOSES  Final diagnoses:  Gastroenteritis     Note: This dictation was prepared with Dragon dictation. Any transcriptional errors that result from this process are unintentional     Phineas Semen, MD 11/17/18 2209

## 2018-11-17 NOTE — ED Notes (Signed)
Peripheral IV discontinued. Catheter intact. No signs of infiltration or redness. Gauze applied to IV site.   Discharge instructions reviewed with patient. Questions fielded by this RN. Patient verbalizes understanding of instructions. Patient discharged home in stable condition per Goodman. No acute distress noted at time of discharge.   

## 2018-11-17 NOTE — Discharge Instructions (Addendum)
Please seek medical attention for any high fevers, chest pain, shortness of breath, change in behavior, persistent vomiting, bloody stool or any other new or concerning symptoms.  

## 2018-11-17 NOTE — ED Triage Notes (Signed)
Pt c/o fever, N/V/D x1 day. Pt took tylenol at 1900.

## 2024-03-22 ENCOUNTER — Telehealth: Payer: Self-pay

## 2024-03-22 NOTE — Telephone Encounter (Signed)
 Patient states she had BTL 04/14/18. She is inquiring if it is reversible? Op note reviewed, advised a 3 cm segment of each tube was ligated (tied) this is considered to be reversible. Advised to schedule appointment with a provider who performs this procedure.
